# Patient Record
Sex: Female | Born: 1940 | Hispanic: No | Marital: Married | Smoking: Never smoker
Health system: Southern US, Community
[De-identification: ages and names within clinical notes are randomized; demographics above are authoritative.]

## PROBLEM LIST (undated history)

## (undated) DIAGNOSIS — M1990 Unspecified osteoarthritis, unspecified site: Secondary | ICD-10-CM

## (undated) DIAGNOSIS — Z9889 Other specified postprocedural states: Secondary | ICD-10-CM

## (undated) DIAGNOSIS — T8859XA Other complications of anesthesia, initial encounter: Secondary | ICD-10-CM

## (undated) DIAGNOSIS — Z87448 Personal history of other diseases of urinary system: Secondary | ICD-10-CM

## (undated) DIAGNOSIS — R112 Nausea with vomiting, unspecified: Secondary | ICD-10-CM

## (undated) HISTORY — PX: EYE SURGERY: SHX253

## (undated) HISTORY — DX: Unspecified osteoarthritis, unspecified site: M19.90

## (undated) HISTORY — PX: CYST EXCISION: SHX5701

## (undated) HISTORY — DX: Personal history of other diseases of urinary system: Z87.448

---

## 1999-12-17 HISTORY — PX: KNEE SURGERY: SHX244

## 2002-01-15 HISTORY — PX: KNEE SURGERY: SHX244

## 2019-11-13 ENCOUNTER — Ambulatory Visit (INDEPENDENT_AMBULATORY_CARE_PROVIDER_SITE_OTHER): Payer: Medicare HMO | Admitting: Orthopaedic Surgery

## 2019-11-13 ENCOUNTER — Other Ambulatory Visit: Payer: Self-pay

## 2019-11-13 ENCOUNTER — Encounter: Payer: Self-pay | Admitting: Orthopaedic Surgery

## 2019-11-13 ENCOUNTER — Ambulatory Visit (INDEPENDENT_AMBULATORY_CARE_PROVIDER_SITE_OTHER): Payer: Medicare HMO

## 2019-11-13 VITALS — BP 139/85 | HR 82 | Ht 63.0 in | Wt 176.0 lb

## 2019-11-13 DIAGNOSIS — G8929 Other chronic pain: Secondary | ICD-10-CM

## 2019-11-13 DIAGNOSIS — T84033A Mechanical loosening of internal left knee prosthetic joint, initial encounter: Secondary | ICD-10-CM | POA: Diagnosis not present

## 2019-11-13 DIAGNOSIS — M25562 Pain in left knee: Secondary | ICD-10-CM | POA: Diagnosis not present
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
## 2019-11-16 DIAGNOSIS — T84033A Mechanical loosening of internal left knee prosthetic joint, initial encounter: Secondary | ICD-10-CM | POA: Insufficient documentation

## 2019-11-24 ENCOUNTER — Other Ambulatory Visit: Payer: Self-pay

## 2019-11-25 ENCOUNTER — Telehealth: Payer: Self-pay | Admitting: Radiology

## 2019-11-25 NOTE — Telephone Encounter (Signed)
I left voicemail requesting return call from patient.  We need to find out in which facility Dr. Mila Palmer did her left total knee surgery in 2001 so that we can get records in regards to what implant she currently has.

## 2019-11-28 NOTE — Telephone Encounter (Signed)
I left voicemail for return call. 

## 2019-12-01 NOTE — Telephone Encounter (Signed)
I have tried to reach patient x 2. Did Dr. Ophelia Charter have you trying to reach her as well to find out what type of implant she has? She has not returned my call.

## 2019-12-15 ENCOUNTER — Telehealth: Payer: Self-pay | Admitting: Orthopaedic Surgery

## 2019-12-15 NOTE — Telephone Encounter (Signed)
Patient called to say she went directly to [facility]. She requested the records related to her left knee arthroplasty done in October of 1999.  She was told everything prior to 2012 had been "destroyed".  She states having had the right knee done in February 2004. She will be emailing 6 pages of notes obtained from the offices of Dr. Mila Palmer & Dr. Dimas Aguas (both retired) relating to the right knee just in case there is mention of the equipment.  She has read through the notes and does not see anything, but would like for Dr. Ophelia Charter to have.

## 2019-12-15 NOTE — Telephone Encounter (Signed)
FYI

## 2019-12-16 NOTE — Telephone Encounter (Signed)
OK , we will have to schedule her for revision and both femur and tibial components.      TKA Revision with Depuy . Thanks

## 2019-12-16 NOTE — Telephone Encounter (Signed)
noted 

## 2019-12-24 ENCOUNTER — Ambulatory Visit (INDEPENDENT_AMBULATORY_CARE_PROVIDER_SITE_OTHER): Payer: Medicare HMO | Admitting: Surgery

## 2019-12-24 ENCOUNTER — Encounter: Payer: Self-pay | Admitting: Surgery

## 2019-12-24 ENCOUNTER — Other Ambulatory Visit: Payer: Self-pay

## 2019-12-24 VITALS — BP 179/82 | HR 75 | Ht 63.0 in | Wt 176.0 lb

## 2019-12-24 DIAGNOSIS — T84033A Mechanical loosening of internal left knee prosthetic joint, initial encounter: Secondary | ICD-10-CM

## 2019-12-24 NOTE — Progress Notes (Signed)
79 year old white female with history of left total knee component loosening comes in for preop evaluation.  States that she continues have ongoing feeling of pain instability in the left knee.  She is want to proceed with left total knee revision as scheduled.  We have received preop medical and cardiac clearance.  Review of systems negative.  History and physical performed.  Surgical procedure discussed.  All questions answered.

## 2019-12-29 NOTE — Pre-Procedure Instructions (Signed)
[facility] [phone] - [place], [place] - [address] AT [address] [place] [address] Phone: [phone] Fax: [phone]      Your procedure is scheduled on Friday September 17th.  Report to [facility] Main Entrance "A" at 5:30 A.M., and check in at the Admitting office.  Call this number if you have problems the morning of surgery:  [phone]  Call [phone] if you have any questions prior to your surgery date Monday-Friday 8am-4pm    Remember:  Do not eat after midnight the night before your surgery  You may drink clear liquids until 4:30 the morning of your surgery.   Clear liquids allowed are: Water, Non-Citrus Juices (without pulp), Carbonated Beverages, Clear Tea, Black Coffee Only, and Gatorade Patient Instructions  . The night before surgery:  o No food after midnight. ONLY clear liquids after midnight  . The day of surgery (if you do NOT have diabetes):  o Drink ONE (1) Pre-Surgery Clear Ensure as directed.   o This drink was given to you during your hospital  pre-op appointment visit. o The pre-op nurse will instruct you on the time to drink the  Pre-Surgery Ensure depending on your surgery time- finish by 4:30 AM morning of surgery o Finish the drink at the designated time by the pre-op nurse.  o Nothing else to drink after completing the  Pre-Surgery Clear Ensure.         If you have questions, please contact your surgeon's office.     Take these medicines the morning of surgery with A SIP OF WATER -NONE  As of today, STOP taking any Aspirin (unless otherwise instructed by your surgeon) Aleve, Naproxen, Ibuprofen, Motrin, Advil, Goody's, BC's, all herbal medications, fish oil, and all vitamins.                      Do not wear jewelry, make up, or nail polish            Do not wear lotions, powders, perfumes/colognes, or deodorant.            Do not shave 48 hours prior to surgery.  Men may shave face and  neck.            Do not bring valuables to the hospital.            [facility] is not responsible for any belongings or valuables.  Do NOT Smoke (Tobacco/Vaping) or drink Alcohol 24 hours prior to your procedure If you use a CPAP at night, you may bring all equipment for your overnight stay.   Contacts, glasses, dentures or bridgework may not be worn into surgery.      For patients admitted to the hospital, discharge time will be determined by your treatment team.   Patients discharged the day of surgery will not be allowed to drive home, and someone needs to stay with them for 24 hours.    Special instructions:   [facility]- Preparing For Surgery  Before surgery, you can play an important role. Because skin is not sterile, your skin needs to be as free of germs as possible. You can reduce the number of germs on your skin by washing with CHG (chlorahexidine gluconate) Soap before surgery.  CHG is an antiseptic cleaner which kills germs and bonds with the skin to continue killing germs even after washing.    Oral Hygiene is also important to reduce your risk of  infection.  Remember - BRUSH YOUR TEETH THE MORNING OF SURGERY WITH YOUR REGULAR TOOTHPASTE  Please do not use if you have an allergy to CHG or antibacterial soaps. If your skin becomes reddened/irritated stop using the CHG.  Do not shave (including legs and underarms) for at least 48 hours prior to first CHG shower. It is OK to shave your face.  Please follow these instructions carefully.   1. Shower the NIGHT BEFORE SURGERY and the MORNING OF SURGERY with CHG Soap.   2. If you chose to wash your hair, wash your hair first as usual with your normal shampoo.  3. After you shampoo, rinse your hair and body thoroughly to remove the shampoo.  4. Use CHG as you would any other liquid soap. You can apply CHG directly to the skin and wash gently with a scrungie or a clean washcloth.   5. Apply the CHG Soap to your body ONLY FROM  THE NECK DOWN.  Do not use on open wounds or open sores. Avoid contact with your eyes, ears, mouth and genitals (private parts). Wash Face and genitals (private parts)  with your normal soap.   6. Wash thoroughly, paying special attention to the area where your surgery will be performed.  7. Thoroughly rinse your body with warm water from the neck down.  8. DO NOT shower/wash with your normal soap after using and rinsing off the CHG Soap.  9. Pat yourself dry with a CLEAN TOWEL.  10. Wear CLEAN PAJAMAS to bed the night before surgery  11. Place CLEAN SHEETS on your bed the night of your first shower and DO NOT SLEEP WITH PETS.   Day of Surgery: Wear Clean/Comfortable clothing the morning of surgery Do not apply any deodorants/lotions.   Remember to brush your teeth WITH YOUR REGULAR TOOTHPASTE.   Please read over the following fact sheets that you were given.

## 2019-12-30 ENCOUNTER — Encounter (HOSPITAL_COMMUNITY)
Admission: RE | Admit: 2019-12-30 | Discharge: 2019-12-30 | Disposition: A | Discharge status: Home / Self Care | Payer: Medicare HMO | Source: Ambulatory Visit | Attending: Orthopaedic Surgery | Admitting: Orthopaedic Surgery

## 2019-12-30 ENCOUNTER — Ambulatory Visit (HOSPITAL_COMMUNITY)
Admission: RE | Admit: 2019-12-30 | Discharge: 2019-12-30 | Disposition: A | Discharge status: Home / Self Care | Payer: Medicare HMO | Source: Ambulatory Visit | Attending: Surgery | Admitting: Surgery

## 2019-12-30 ENCOUNTER — Other Ambulatory Visit: Payer: Self-pay

## 2019-12-30 ENCOUNTER — Encounter (HOSPITAL_COMMUNITY): Payer: Self-pay

## 2019-12-30 ENCOUNTER — Other Ambulatory Visit (HOSPITAL_COMMUNITY)
Admission: RE | Admit: 2019-12-30 | Discharge: 2019-12-30 | Disposition: A | Discharge status: Home / Self Care | Payer: Medicare HMO | Source: Ambulatory Visit | Attending: Orthopaedic Surgery | Admitting: Orthopaedic Surgery

## 2019-12-30 DIAGNOSIS — Z01818 Encounter for other preprocedural examination: Secondary | ICD-10-CM

## 2019-12-30 DIAGNOSIS — K449 Diaphragmatic hernia without obstruction or gangrene: Secondary | ICD-10-CM | POA: Diagnosis not present

## 2019-12-30 DIAGNOSIS — Z20822 Contact with and (suspected) exposure to covid-19: Secondary | ICD-10-CM | POA: Diagnosis not present

## 2019-12-30 DIAGNOSIS — I700 Atherosclerosis of aorta: Secondary | ICD-10-CM | POA: Insufficient documentation

## 2019-12-30 HISTORY — DX: Nausea with vomiting, unspecified: Z98.890

## 2019-12-30 HISTORY — DX: Nausea with vomiting, unspecified: R11.2

## 2019-12-30 HISTORY — DX: Other complications of anesthesia, initial encounter: T88.59XA

## 2019-12-30 LAB — URINALYSIS, ROUTINE W REFLEX MICROSCOPIC
Bacteria, UA: NONE SEEN
Bilirubin Urine: NEGATIVE
Glucose, UA: NEGATIVE mg/dL
Hgb urine dipstick: NEGATIVE
Ketones, ur: NEGATIVE mg/dL
Nitrite: NEGATIVE
Protein, ur: NEGATIVE mg/dL
Specific Gravity, Urine: 1.017 (ref 1.005–1.030)
pH: 6 (ref 5.0–8.0)

## 2019-12-30 LAB — CBC
HCT: 42.6 % (ref 36.0–46.0)
Hemoglobin: 13.2 g/dL (ref 12.0–15.0)
MCH: 29.7 pg (ref 26.0–34.0)
MCHC: 31 g/dL (ref 30.0–36.0)
MCV: 95.7 fL (ref 80.0–100.0)
Platelets: 321 10*3/uL (ref 150–400)
RBC: 4.45 MIL/uL (ref 3.87–5.11)
RDW: 15 % (ref 11.5–15.5)
WBC: 7.2 10*3/uL (ref 4.0–10.5)
nRBC: 0 % (ref 0.0–0.2)

## 2019-12-30 LAB — COMPREHENSIVE METABOLIC PANEL
ALT: 12 U/L (ref 0–44)
AST: 18 U/L (ref 15–41)
Albumin: 4 g/dL (ref 3.5–5.0)
Alkaline Phosphatase: 48 U/L (ref 38–126)
Anion gap: 10 (ref 5–15)
BUN: 20 mg/dL (ref 8–23)
CO2: 24 mmol/L (ref 22–32)
Calcium: 9.6 mg/dL (ref 8.9–10.3)
Chloride: 103 mmol/L (ref 98–111)
Creatinine, Ser: 0.75 mg/dL (ref 0.44–1.00)
GFR calc Af Amer: >60 mL/min (ref >60)
GFR calc non Af Amer: >60 mL/min (ref >60)
Glucose, Bld: 97 mg/dL (ref 70–99)
Potassium: 3.8 mmol/L (ref 3.5–5.1)
Sodium: 137 mmol/L (ref 135–145)
Total Bilirubin: 0.6 mg/dL (ref 0.3–1.2)
Total Protein: 7 g/dL (ref 6.5–8.1)

## 2019-12-30 LAB — SURGICAL PCR SCREEN
MRSA, PCR: NEGATIVE
Staphylococcus aureus: NEGATIVE

## 2019-12-30 LAB — SARS CORONAVIRUS 2 (TAT 6-24 HRS): SARS Coronavirus 2: NEGATIVE

## 2019-12-30 LAB — TYPE AND SCREEN
ABO/RH(D): O POS
Antibody Screen: NEGATIVE

## 2019-12-30 NOTE — Progress Notes (Signed)
PCP - dr. Carin Primrose in [place], [place] Cardiologist - Denies  Chest x-ray - 12/30/19 EKG - 12/30/19 Stress Test - Denies ECHO - Denies Cardiac Cath - Denies  Sleep Study - Denies OSA DM - Denies  ERAS Protcol -Instructed PRE-SURGERY Ensure given  COVID TEST- 12/30/19  Anesthesia review: No  Patient denies shortness of breath, fever, cough and chest pain at PAT appointment   All instructions explained to the patient, with a verbal understanding of the material. Patient agrees to go over the instructions while at home for a better understanding. Patient also instructed to self quarantine after being tested for COVID-19. The opportunity to ask questions was provided.

## 2020-01-01 MED ORDER — BUPIVACAINE LIPOSOME 1.3 % IJ SUSP
20.0000 mL | Freq: Once | INTRAMUSCULAR | Status: DC
  Filled 2020-01-01: qty 20

## 2020-01-01 NOTE — Anesthesia Preprocedure Evaluation (Addendum)
Anesthesia Evaluation  Patient identified by MRN, date of birth, ID band Patient awake    Reviewed: Allergy & Precautions, NPO status , Patient's Chart, lab work & pertinent test results  Airway Mallampati: II  TM Distance: >3 FB Neck ROM: Full    Dental no notable dental hx. (+) Upper Dentures, Dental Advisory Given   Pulmonary neg pulmonary ROS,    Pulmonary exam normal breath sounds clear to auscultation       Cardiovascular Exercise Tolerance: Good negative cardio ROS Normal cardiovascular exam Rhythm:Regular Rate:Normal  12-30-19  NSR R73   Neuro/Psych negative neurological ROS  negative psych ROS   GI/Hepatic negative GI ROS, Neg liver ROS,   Endo/Other  negative endocrine ROS  Renal/GU negative Renal ROS     Musculoskeletal  (+) Arthritis ,   Abdominal (+) + obese,   Peds  Hematology negative hematology ROS (+)   Anesthesia Other Findings   Reproductive/Obstetrics negative OB ROS                            Anesthesia Physical Anesthesia Plan  ASA: II  Anesthesia Plan: Spinal   Post-op Pain Management:  Regional for Post-op pain   Induction:   PONV Risk Score and Plan: 3 and Treatment may vary due to age or medical condition, Ondansetron and Dexamethasone  Airway Management Planned: Nasal Cannula and Natural Airway  Additional Equipment: None  Intra-op Plan:   Post-operative Plan:   Informed Consent: I have reviewed the patients History and Physical, chart, labs and discussed the procedure including the risks, benefits and alternatives for the proposed anesthesia with the patient or authorized representative who has indicated his/her understanding and acceptance.     Dental advisory given  Plan Discussed with: CRNA and Anesthesiologist  Anesthesia Plan Comments: (Spinal w L Adductor canal block)       Anesthesia Quick Evaluation

## 2020-01-02 ENCOUNTER — Observation Stay (HOSPITAL_COMMUNITY): Payer: Medicare HMO

## 2020-01-02 ENCOUNTER — Encounter (HOSPITAL_COMMUNITY)
Admission: RE | Disposition: A | Discharge status: Home / Self Care | Payer: Self-pay | Source: Home / Self Care | Attending: Orthopaedic Surgery

## 2020-01-02 ENCOUNTER — Observation Stay (HOSPITAL_COMMUNITY)
Admission: RE | Admit: 2020-01-02 | Discharge: 2020-01-03 | Disposition: A | Discharge status: Home / Self Care | Payer: Medicare HMO | Attending: Orthopaedic Surgery | Admitting: Orthopaedic Surgery

## 2020-01-02 ENCOUNTER — Encounter (HOSPITAL_COMMUNITY): Payer: Self-pay | Admitting: Orthopaedic Surgery

## 2020-01-02 ENCOUNTER — Ambulatory Visit (HOSPITAL_COMMUNITY): Payer: Medicare HMO | Admitting: Anesthesiology

## 2020-01-02 ENCOUNTER — Ambulatory Visit (HOSPITAL_COMMUNITY): Payer: Medicare HMO

## 2020-01-02 ENCOUNTER — Ambulatory Visit (HOSPITAL_COMMUNITY): Payer: Medicare HMO | Admitting: Vascular Surgery

## 2020-01-02 ENCOUNTER — Other Ambulatory Visit: Payer: Self-pay

## 2020-01-02 DIAGNOSIS — M25562 Pain in left knee: Secondary | ICD-10-CM

## 2020-01-02 DIAGNOSIS — Y838 Other surgical procedures as the cause of abnormal reaction of the patient, or of later complication, without mention of misadventure at the time of the procedure: Secondary | ICD-10-CM | POA: Diagnosis not present

## 2020-01-02 DIAGNOSIS — M1712 Unilateral primary osteoarthritis, left knee: Secondary | ICD-10-CM | POA: Insufficient documentation

## 2020-01-02 DIAGNOSIS — Z96652 Presence of left artificial knee joint: Secondary | ICD-10-CM

## 2020-01-02 DIAGNOSIS — Y793 Surgical instruments, materials and orthopedic devices (including sutures) associated with adverse incidents: Secondary | ICD-10-CM | POA: Insufficient documentation

## 2020-01-02 DIAGNOSIS — T8484XA Pain due to internal orthopedic prosthetic devices, implants and grafts, initial encounter: Secondary | ICD-10-CM | POA: Diagnosis present

## 2020-01-02 DIAGNOSIS — T84033A Mechanical loosening of internal left knee prosthetic joint, initial encounter: Secondary | ICD-10-CM | POA: Diagnosis not present

## 2020-01-02 DIAGNOSIS — Z419 Encounter for procedure for purposes other than remedying health state, unspecified: Secondary | ICD-10-CM | POA: Diagnosis not present

## 2020-01-02 DIAGNOSIS — Z09 Encounter for follow-up examination after completed treatment for conditions other than malignant neoplasm: Secondary | ICD-10-CM

## 2020-01-02 HISTORY — PX: TOTAL KNEE REVISION: SHX996

## 2020-01-02 LAB — ABO/RH: ABO/RH(D): O POS

## 2020-01-02 SURGERY — TOTAL KNEE REVISION
Anesthesia: Spinal | Site: Knee | Laterality: Left

## 2020-01-02 MED ORDER — FERROUS SULFATE 325 (65 FE) MG PO TABS
325.0000 mg | ORAL_TABLET | Freq: Two times a day (BID) | ORAL | Status: DC
  Administered 2020-01-02 – 2020-01-03 (×2): 325 mg via ORAL
  Filled 2020-01-02 (×2): qty 1

## 2020-01-02 MED ORDER — METHOCARBAMOL 500 MG PO TABS: 500.0000 mg | ORAL_TABLET | Freq: Four times a day (QID) | ORAL | 0 refills | Status: AC | PRN

## 2020-01-02 MED ORDER — ONDANSETRON HCL 4 MG/2ML IJ SOLN
INTRAMUSCULAR | Status: DC | PRN
  Administered 2020-01-02: 4 mg via INTRAVENOUS

## 2020-01-02 MED ORDER — FENTANYL CITRATE (PF) 250 MCG/5ML IJ SOLN
INTRAMUSCULAR | Status: AC
  Filled 2020-01-02: qty 5

## 2020-01-02 MED ORDER — ACETAMINOPHEN 500 MG PO TABS
ORAL_TABLET | ORAL | Status: AC
  Administered 2020-01-02: 1000 mg
  Filled 2020-01-02: qty 2

## 2020-01-02 MED ORDER — CHLORHEXIDINE GLUCONATE 0.12 % MT SOLN: 15.0000 mL | Freq: Once | OROMUCOSAL | Status: AC

## 2020-01-02 MED ORDER — ASPIRIN EC 325 MG PO TBEC: 325.0000 mg | DELAYED_RELEASE_TABLET | Freq: Every day | ORAL | 0 refills | Status: AC

## 2020-01-02 MED ORDER — BUPIVACAINE IN DEXTROSE 0.75-8.25 % IT SOLN
INTRATHECAL | Status: DC | PRN
  Administered 2020-01-02: 12 mg via INTRATHECAL

## 2020-01-02 MED ORDER — CEFAZOLIN SODIUM-DEXTROSE 2-4 GM/100ML-% IV SOLN
2.0000 g | INTRAVENOUS | Status: AC
  Administered 2020-01-02: 2 g via INTRAVENOUS

## 2020-01-02 MED ORDER — ACETAMINOPHEN 10 MG/ML IV SOLN: 1000.0000 mg | Freq: Once | INTRAVENOUS | Status: DC | PRN

## 2020-01-02 MED ORDER — ASPIRIN EC 325 MG PO TBEC
325.0000 mg | DELAYED_RELEASE_TABLET | Freq: Every day | ORAL | Status: DC
  Administered 2020-01-03: 325 mg via ORAL
  Filled 2020-01-02: qty 1

## 2020-01-02 MED ORDER — DEXAMETHASONE SODIUM PHOSPHATE 10 MG/ML IJ SOLN
INTRAMUSCULAR | Status: DC | PRN
  Administered 2020-01-02: 10 mg via INTRAVENOUS

## 2020-01-02 MED ORDER — DEXAMETHASONE SODIUM PHOSPHATE 10 MG/ML IJ SOLN
INTRAMUSCULAR | Status: AC
  Filled 2020-01-02: qty 1

## 2020-01-02 MED ORDER — BUPIVACAINE LIPOSOME 1.3 % IJ SUSP
INTRAMUSCULAR | Status: DC | PRN
  Administered 2020-01-02: 20 mL

## 2020-01-02 MED ORDER — ORAL CARE MOUTH RINSE: 15.0000 mL | Freq: Once | OROMUCOSAL | Status: AC

## 2020-01-02 MED ORDER — CEFAZOLIN SODIUM-DEXTROSE 1-4 GM/50ML-% IV SOLN
1.0000 g | Freq: Three times a day (TID) | INTRAVENOUS | Status: AC
  Administered 2020-01-02 (×2): 1 g via INTRAVENOUS
  Filled 2020-01-02 (×3): qty 50

## 2020-01-02 MED ORDER — ONDANSETRON HCL 4 MG/2ML IJ SOLN: 4.0000 mg | Freq: Once | INTRAMUSCULAR | Status: DC | PRN

## 2020-01-02 MED ORDER — CHLORHEXIDINE GLUCONATE 0.12 % MT SOLN
OROMUCOSAL | Status: AC
  Administered 2020-01-02: 15 mL via OROMUCOSAL
  Filled 2020-01-02: qty 15

## 2020-01-02 MED ORDER — ACETAMINOPHEN 500 MG PO TABS
ORAL_TABLET | ORAL | Status: AC
  Filled 2020-01-02: qty 2

## 2020-01-02 MED ORDER — METHOCARBAMOL 500 MG PO TABS
ORAL_TABLET | ORAL | Status: AC
  Filled 2020-01-02: qty 1

## 2020-01-02 MED ORDER — METHOCARBAMOL 500 MG PO TABS
500.0000 mg | ORAL_TABLET | Freq: Four times a day (QID) | ORAL | Status: DC | PRN
  Administered 2020-01-02 – 2020-01-03 (×3): 500 mg via ORAL
  Filled 2020-01-02 (×2): qty 1

## 2020-01-02 MED ORDER — ROPIVACAINE HCL 7.5 MG/ML IJ SOLN
INTRAMUSCULAR | Status: DC | PRN
  Administered 2020-01-02: 20 mL via PERINEURAL

## 2020-01-02 MED ORDER — ONDANSETRON HCL 4 MG PO TABS: 4.0000 mg | ORAL_TABLET | Freq: Four times a day (QID) | ORAL | Status: DC | PRN

## 2020-01-02 MED ORDER — TRANEXAMIC ACID-NACL 1000-0.7 MG/100ML-% IV SOLN
INTRAVENOUS | Status: AC
  Filled 2020-01-02: qty 100

## 2020-01-02 MED ORDER — CLONIDINE HCL (ANALGESIA) 100 MCG/ML EP SOLN
EPIDURAL | Status: DC | PRN
  Administered 2020-01-02: 100 ug

## 2020-01-02 MED ORDER — METOCLOPRAMIDE HCL 5 MG PO TABS: 5.0000 mg | ORAL_TABLET | Freq: Three times a day (TID) | ORAL | Status: DC | PRN

## 2020-01-02 MED ORDER — POLYETHYLENE GLYCOL 3350 17 G PO PACK: 17.0000 g | PACK | Freq: Every day | ORAL | Status: DC | PRN

## 2020-01-02 MED ORDER — HYDROMORPHONE HCL 1 MG/ML IJ SOLN
0.5000 mg | INTRAMUSCULAR | Status: DC | PRN
  Administered 2020-01-02 – 2020-01-03 (×2): 0.5 mg via INTRAVENOUS
  Filled 2020-01-02 (×2): qty 1

## 2020-01-02 MED ORDER — FENTANYL CITRATE (PF) 100 MCG/2ML IJ SOLN: 25.0000 ug | INTRAMUSCULAR | Status: DC | PRN

## 2020-01-02 MED ORDER — SODIUM CHLORIDE 0.9 % IV SOLN: INTRAVENOUS | Status: DC

## 2020-01-02 MED ORDER — OXYCODONE HCL 5 MG PO TABS
5.0000 mg | ORAL_TABLET | Freq: Four times a day (QID) | ORAL | Status: DC | PRN
  Administered 2020-01-02 – 2020-01-03 (×4): 5 mg via ORAL
  Filled 2020-01-02 (×5): qty 1

## 2020-01-02 MED ORDER — PROPOFOL 10 MG/ML IV BOLUS
INTRAVENOUS | Status: AC
  Filled 2020-01-02: qty 20

## 2020-01-02 MED ORDER — TRANEXAMIC ACID-NACL 1000-0.7 MG/100ML-% IV SOLN
INTRAVENOUS | Status: DC | PRN
  Administered 2020-01-02: 1000 mg via INTRAVENOUS

## 2020-01-02 MED ORDER — LIDOCAINE 2% (20 MG/ML) 5 ML SYRINGE
INTRAMUSCULAR | Status: AC
  Filled 2020-01-02: qty 5

## 2020-01-02 MED ORDER — MIDAZOLAM HCL 2 MG/2ML IJ SOLN
INTRAMUSCULAR | Status: AC
  Filled 2020-01-02: qty 2

## 2020-01-02 MED ORDER — PHENYLEPHRINE HCL-NACL 10-0.9 MG/250ML-% IV SOLN
INTRAVENOUS | Status: DC | PRN
  Administered 2020-01-02: 25 ug/min via INTRAVENOUS

## 2020-01-02 MED ORDER — OXYCODONE-ACETAMINOPHEN 5-325 MG PO TABS: 1.0000 | ORAL_TABLET | Freq: Four times a day (QID) | ORAL | 0 refills | Status: AC | PRN

## 2020-01-02 MED ORDER — METOCLOPRAMIDE HCL 5 MG/ML IJ SOLN: 5.0000 mg | Freq: Three times a day (TID) | INTRAMUSCULAR | Status: DC | PRN

## 2020-01-02 MED ORDER — PHENOL 1.4 % MT LIQD: 1.0000 | OROMUCOSAL | Status: DC | PRN

## 2020-01-02 MED ORDER — ONDANSETRON HCL 4 MG/2ML IJ SOLN
INTRAMUSCULAR | Status: AC
  Filled 2020-01-02: qty 2

## 2020-01-02 MED ORDER — CEFAZOLIN SODIUM-DEXTROSE 2-4 GM/100ML-% IV SOLN
INTRAVENOUS | Status: AC
  Filled 2020-01-02: qty 100

## 2020-01-02 MED ORDER — BUPIVACAINE HCL (PF) 0.25 % IJ SOLN
INTRAMUSCULAR | Status: AC
  Filled 2020-01-02: qty 30

## 2020-01-02 MED ORDER — FENTANYL CITRATE (PF) 100 MCG/2ML IJ SOLN
INTRAMUSCULAR | Status: DC | PRN
  Administered 2020-01-02: 50 ug via INTRAVENOUS

## 2020-01-02 MED ORDER — BUPIVACAINE HCL (PF) 0.25 % IJ SOLN
INTRAMUSCULAR | Status: DC | PRN
  Administered 2020-01-02: 30 mL

## 2020-01-02 MED ORDER — LACTATED RINGERS IV SOLN: INTRAVENOUS | Status: DC | PRN

## 2020-01-02 MED ORDER — ACETAMINOPHEN 325 MG PO TABS
325.0000 mg | ORAL_TABLET | Freq: Four times a day (QID) | ORAL | Status: DC | PRN
  Administered 2020-01-03 (×2): 650 mg via ORAL
  Filled 2020-01-02 (×3): qty 2

## 2020-01-02 MED ORDER — ONDANSETRON HCL 4 MG/2ML IJ SOLN
4.0000 mg | Freq: Four times a day (QID) | INTRAMUSCULAR | Status: DC | PRN
  Administered 2020-01-02 – 2020-01-03 (×2): 4 mg via INTRAVENOUS
  Filled 2020-01-02 (×2): qty 2

## 2020-01-02 MED ORDER — DOCUSATE SODIUM 100 MG PO CAPS
100.0000 mg | ORAL_CAPSULE | Freq: Two times a day (BID) | ORAL | Status: DC
  Administered 2020-01-02 (×2): 100 mg via ORAL
  Filled 2020-01-02 (×3): qty 1

## 2020-01-02 MED ORDER — METHOCARBAMOL 1000 MG/10ML IJ SOLN
500.0000 mg | Freq: Four times a day (QID) | INTRAVENOUS | Status: DC | PRN
  Filled 2020-01-02: qty 5

## 2020-01-02 MED ORDER — PROPOFOL 500 MG/50ML IV EMUL
INTRAVENOUS | Status: DC | PRN
  Administered 2020-01-02: 75 ug/kg/min via INTRAVENOUS

## 2020-01-02 MED ORDER — SODIUM CHLORIDE 0.9 % IR SOLN
Status: DC | PRN
  Administered 2020-01-02: 3000 mL
  Administered 2020-01-02: 1000 mL

## 2020-01-02 MED ORDER — LACTATED RINGERS IV SOLN: INTRAVENOUS | Status: DC

## 2020-01-02 MED ORDER — MENTHOL 3 MG MT LOZG: 1.0000 | LOZENGE | OROMUCOSAL | Status: DC | PRN

## 2020-01-02 SURGICAL SUPPLY — 77 items
BANDAGE ESMARK 6X9 LF (GAUZE/BANDAGES/DRESSINGS) ×1 IMPLANT
BIT DRILL QUICK REL 1/8 2PK SL (DRILL) ×2 IMPLANT
BLADE CLIPPER SURG (BLADE) IMPLANT
BLADE SAGITTAL 25.0X1.19X90 (BLADE) ×2 IMPLANT
BLADE SAGITTAL 25.0X1.19X90MM (BLADE) ×1
BLADE SAGITTAL 25.0X1.27X90 (BLADE) ×4 IMPLANT
BLADE SAGITTAL 25.0X1.27X90MM (BLADE) ×2
BLADE SAW SGTL 81X20 HD (BLADE) ×3 IMPLANT
BNDG ELASTIC 4X5.8 VLCR STR LF (GAUZE/BANDAGES/DRESSINGS) ×3 IMPLANT
BNDG ELASTIC 6X5.8 VLCR STR LF (GAUZE/BANDAGES/DRESSINGS) ×3 IMPLANT
BNDG ESMARK 6X9 LF (GAUZE/BANDAGES/DRESSINGS) ×3
BOWL SMART MIX CTS (DISPOSABLE) IMPLANT
CEMENT BONE R 1X40 (Cement) ×6 IMPLANT
COVER BACK TABLE 24X17X13 BIG (DRAPES) IMPLANT
COVER SURGICAL LIGHT HANDLE (MISCELLANEOUS) ×3 IMPLANT
COVER WAND RF STERILE (DRAPES) ×3 IMPLANT
CUFF TOURN SGL QUICK 34 (TOURNIQUET CUFF)
CUFF TOURN SGL QUICK 42 (TOURNIQUET CUFF) IMPLANT
CUFF TRNQT CYL 34X4.125X (TOURNIQUET CUFF) IMPLANT
DISC DIAMOND MED (BURR) IMPLANT
DRAPE C-ARM 42X72 X-RAY (DRAPES) ×3 IMPLANT
DRAPE IMP U-DRAPE 54X76 (DRAPES) ×3 IMPLANT
DRAPE ORTHO SPLIT 77X108 STRL (DRAPES) ×4
DRAPE SURG ORHT 6 SPLT 77X108 (DRAPES) ×2 IMPLANT
DRAPE U-SHAPE 47X51 STRL (DRAPES) ×3 IMPLANT
DRILL QUICK RELEASE 1/8 INCH (DRILL) ×4
DRSG MEPILEX BORDER 4X8 (GAUZE/BANDAGES/DRESSINGS) ×3 IMPLANT
DRSG MEPILEX SACRM 8.7X9.8 (GAUZE/BANDAGES/DRESSINGS) ×3 IMPLANT
DRSG PAD ABDOMINAL 8X10 ST (GAUZE/BANDAGES/DRESSINGS) ×3 IMPLANT
DURAPREP 26ML APPLICATOR (WOUND CARE) ×3 IMPLANT
ELECT REM PT RETURN 9FT ADLT (ELECTROSURGICAL) ×3
ELECTRODE REM PT RTRN 9FT ADLT (ELECTROSURGICAL) ×1 IMPLANT
EVACUATOR 1/8 PVC DRAIN (DRAIN) IMPLANT
GAUZE SPONGE 4X4 12PLY STRL (GAUZE/BANDAGES/DRESSINGS) ×3 IMPLANT
GAUZE XEROFORM 1X8 LF (GAUZE/BANDAGES/DRESSINGS) ×3 IMPLANT
GLOVE BIOGEL PI IND STRL 8 (GLOVE) ×2 IMPLANT
GLOVE BIOGEL PI INDICATOR 8 (GLOVE) ×4
GLOVE ORTHO TXT STRL SZ7.5 (GLOVE) ×6 IMPLANT
GOWN STRL REUS W/ TWL LRG LVL3 (GOWN DISPOSABLE) ×1 IMPLANT
GOWN STRL REUS W/ TWL XL LVL3 (GOWN DISPOSABLE) ×1 IMPLANT
GOWN STRL REUS W/TWL 2XL LVL3 (GOWN DISPOSABLE) ×3 IMPLANT
GOWN STRL REUS W/TWL LRG LVL3 (GOWN DISPOSABLE) ×2
GOWN STRL REUS W/TWL XL LVL3 (GOWN DISPOSABLE) ×2
HANDPIECE INTERPULSE COAX TIP (DISPOSABLE)
INSERT TIB FIXED AS SZ3-4 (Insert) ×3 IMPLANT
KIT BASIN OR (CUSTOM PROCEDURE TRAY) ×3 IMPLANT
KIT TURNOVER KIT B (KITS) ×3 IMPLANT
MANIFOLD NEPTUNE II (INSTRUMENTS) ×3 IMPLANT
NEEDLE 1/2 CIR MAYO (NEEDLE) ×3 IMPLANT
NS IRRIG 1000ML POUR BTL (IV SOLUTION) ×3 IMPLANT
PACK TOTAL JOINT (CUSTOM PROCEDURE TRAY) ×3 IMPLANT
PACK UNIVERSAL I (CUSTOM PROCEDURE TRAY) ×3 IMPLANT
PAD ARMBOARD 7.5X6 YLW CONV (MISCELLANEOUS) ×6 IMPLANT
PAD CAST 4YDX4 CTTN HI CHSV (CAST SUPPLIES) ×1 IMPLANT
PADDING CAST ABS 6INX4YD NS (CAST SUPPLIES) ×2
PADDING CAST ABS COTTON 6X4 NS (CAST SUPPLIES) ×1 IMPLANT
PADDING CAST COTTON 4X4 STRL (CAST SUPPLIES) ×2
PADDING CAST COTTON 6X4 STRL (CAST SUPPLIES) ×3 IMPLANT
PLATE TIB 4 66X46NT ZIER (Knees) ×1 IMPLANT
RASP HELIOCORDIAL MED (MISCELLANEOUS) IMPLANT
SET HNDPC FAN SPRY TIP SCT (DISPOSABLE) IMPLANT
STAPLER VISISTAT 35W (STAPLE) ×3 IMPLANT
STEM FEM EXT NEXGEN 11X145 (Stem) ×3 IMPLANT
SUCTION FRAZIER HANDLE 10FR (MISCELLANEOUS) ×2
SUCTION TUBE FRAZIER 10FR DISP (MISCELLANEOUS) ×1 IMPLANT
SUT VIC AB 0 CT1 27 (SUTURE) ×4
SUT VIC AB 0 CT1 27XBRD ANBCTR (SUTURE) ×2 IMPLANT
SUT VIC AB 1 CT1 27 (SUTURE) ×4
SUT VIC AB 1 CT1 27XBRD ANBCTR (SUTURE) ×2 IMPLANT
SUT VIC AB 2-0 CT1 27 (SUTURE) ×4
SUT VIC AB 2-0 CT1 TAPERPNT 27 (SUTURE) ×2 IMPLANT
SWAB CULTURE ESWAB REG 1ML (MISCELLANEOUS) ×3 IMPLANT
TIBIA ZIMMER (Knees) ×2 IMPLANT
TOWEL GREEN STERILE (TOWEL DISPOSABLE) ×3 IMPLANT
TOWEL GREEN STERILE FF (TOWEL DISPOSABLE) ×3 IMPLANT
TRAY FOLEY MTR SLVR 16FR STAT (SET/KITS/TRAYS/PACK) IMPLANT
WATER STERILE IRR 1000ML POUR (IV SOLUTION) ×9 IMPLANT

## 2020-01-02 NOTE — Addendum Note (Signed)
Addendum  created 01/02/20 1307 by Trevor Iha, MD   Clinical Note Signed

## 2020-01-02 NOTE — Anesthesia Procedure Notes (Addendum)
Anesthesia Regional Block: Adductor canal block   Pre-Anesthetic Checklist: ,, timeout performed, Correct Patient, Correct Site, Correct Laterality, Correct Procedure, Correct Position, site marked, Risks and benefits discussed,  Surgical consent,  Pre-op evaluation,  At surgeon's request and post-op pain management  Laterality: Lower and Left  Prep: chloraprep       Needles:  Injection technique: Single-shot  Needle Type: Echogenic Needle     Needle Length: 9cm  Needle Gauge: 22     Additional Needles:   Procedures:,,,, ultrasound used (permanent image in chart),,,,  Narrative:  Start time: 01/02/2020 7:08 AM End time: 01/02/2020 7:14 AM Injection made incrementally with aspirations every 5 mL.  Performed by: Personally  Anesthesiologist: Trevor Iha, MD  Additional Notes: Block assessed prior to surgery. Pt tolerated procedure well.

## 2020-01-02 NOTE — H&P (Signed)
TOTAL KNEE REVISION ADMISSION H&P  Patient is being admitted for left revision total knee arthroplasty.  Subjective:  Chief Complaint:left knee pain.  HPI: Barbara Reyes, 79 y.o. female, has a history of pain and functional disability in the left knee(s) due to failed previous arthroplasty and patient has failed non-surgical conservative treatments for greater than 12 weeks to include NSAID's and/or analgesics, supervised PT with diminished ADL's post treatment, weight reduction as appropriate and activity modification. The indications for the revision of the total knee arthroplasty are loosening of one or more components. Onset of symptoms was gradual starting 5 years ago with gradually worsening course since that time.  Prior procedures on the left knee(s) include arthroplasty.  Patient currently rates pain in the left knee(s) at 10 out of 10 with activity. There is night pain, worsening of pain with activity and weight bearing, pain that interferes with activities of daily living, pain with passive range of motion and joint swelling.  Patient has evidence of prosthetic loosening by imaging studies. This condition presents safety issues increasing the risk of falls.   There is no current active infection.  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   Past Medical History:  Diagnosis Date   Arthritis    Complication of anesthesia    H/O bladder problems    PONV (postoperative nausea and vomiting)     Past Surgical History:  Procedure Laterality Date   CYST EXCISION     EYE SURGERY     KNEE SURGERY Right 01/2002   right TKA   KNEE SURGERY Left 12/1999   left TKA    Current Facility-Administered Medications  Medication Dose Route Frequency Provider Last Rate Last Admin   acetaminophen (TYLENOL) 500 MG tablet            bupivacaine liposome (EXPAREL) 1.3 % injection 266 mg  20 mL Infiltration Once [name], MD        ceFAZolin (ANCEF) 2-4 GM/100ML-% IVPB            ceFAZolin (ANCEF) IVPB 2g/100 mL premix  2 g Intravenous On Call to OR Naida Sleight, PA-C       lactated ringers infusion   Intravenous Continuous Marcene Duos, MD       No Known Allergies  Social History   Tobacco Use   Smoking status: Never Smoker   Smokeless tobacco: Never Used  Substance Use Topics   Alcohol use: Not Currently    History reviewed. No pertinent family history.    Review of Systems  Constitutional: Positive for activity change.  HENT: Negative.   Respiratory: Negative.   Cardiovascular: Negative.   Gastrointestinal: Negative.   Genitourinary: Negative.   Musculoskeletal: Positive for gait problem and joint swelling.  Skin: Negative.   Psychiatric/Behavioral: Negative.      Objective:  Physical Exam Constitutional:      General: She is not in acute distress. HENT:     Head: Normocephalic and atraumatic.     Nose: Nose normal.  Eyes:     Extraocular Movements: Extraocular movements intact.     Pupils: Pupils are equal, round, and reactive to light.  Cardiovascular:     Rate and Rhythm: Regular rhythm.     Heart sounds: Normal heart sounds.  Pulmonary:     Effort: Pulmonary effort is normal. No respiratory distress.  Skin:    General: Skin is warm and dry.  Neurological:  General: No focal deficit present.     Mental Status: She is alert and oriented to person, place, and time.  Psychiatric:        Mood and Affect: Mood normal.     Vital signs in last 24 hours: Temp:  [97.7 F (36.5 C)] 97.7 F (36.5 C) (09/17 0556) Pulse Rate:  [72] 72 (09/17 0556) Resp:  [18] 18 (09/17 0556) BP: (164)/(71) 164/71 (09/17 0556) Weight:  [80.7 kg] 80.7 kg (09/17 0556)  Labs:  Estimated body mass index is 31.53 kg/m as calculated from the following:   Height as of this encounter: 5\' 3"  (1.6 m).   Weight as of this encounter: 80.7 kg.  Imaging Review Plain radiographs demonstrate  The overall alignment varus. There is evidence of loosening of the tibial components. The bone quality appears to be good for age and reported activity level..    Assessment/Plan:  End stage arthritis, left knee(s) with failed previous arthroplasty.   The patient history, physical examination, clinical judgment of the provider and imaging studies are consistent with end stage degenerative joint disease of the left knee(s), previous total knee arthroplasty. Revision total knee arthroplasty is deemed medically necessary. The treatment options including medical management, injection therapy, arthroscopy and revision arthroplasty were discussed at length. The risks and benefits of revision total knee arthroplasty were presented and reviewed. The risks due to aseptic loosening, infection, stiffness, patella tracking problems, thromboembolic complications and other imponderables were discussed. The patient acknowledged the explanation, agreed to proceed with the plan and consent was signed. Patient is being admitted for inpatient treatment for surgery, pain control, PT, OT, prophylactic antibiotics, VTE prophylaxis, progressive ambulation and ADL's and discharge planning.The patient is planning to be discharged home with home health services

## 2020-01-02 NOTE — Interval H&P Note (Signed)
History and Physical Interval Note:  01/02/2020 7:27 AM  Barbara Reyes  has presented today for surgery, with the diagnosis of painful loose total knee arthroplasty.  The various methods of treatment have been discussed with the patient and family. After consideration of risks, benefits and other options for treatment, the patient has consented to  Procedure(s): LEFT TOTAL KNEE REVISION (Left) as a surgical intervention.  The patient's history has been reviewed, patient examined, no change in status, stable for surgery.  I have reviewed the patient's chart and labs.  Questions were answered to the patient's satisfaction.     [name]

## 2020-01-02 NOTE — Anesthesia Procedure Notes (Signed)
Procedure Name: MAC Date/Time: 01/02/2020 7:45 AM Performed by: Jenne Campus, CRNA Pre-anesthesia Checklist: Patient identified, Emergency Drugs available, Suction available and Patient being monitored Oxygen Delivery Method: Simple face mask

## 2020-01-02 NOTE — Progress Notes (Signed)
Orthopedic Tech Progress Note Patient Details:  Barbara Reyes 1940/06/10 [id number]  Ortho Devices Type of Ortho Device: Knee Immobilizer Ortho Device/Splint Location: LUE Ortho Device/Splint Interventions: Ordered, Application   Post Interventions Patient Tolerated: Well Instructions Provided: Care of device   Donald Pore 01/02/2020, 12:22 PM

## 2020-01-02 NOTE — Plan of Care (Signed)

## 2020-01-02 NOTE — Anesthesia Postprocedure Evaluation (Signed)
Anesthesia Post Note  Patient: Barbara Reyes  Procedure(s) Performed: LEFT TOTAL KNEE REVISION (Left Knee)     Patient location during evaluation: Nursing Unit Anesthesia Type: Spinal Level of consciousness: oriented and awake and alert Pain management: pain level controlled Vital Signs Assessment: post-procedure vital signs reviewed and stable Respiratory status: spontaneous breathing and respiratory function stable Cardiovascular status: blood pressure returned to baseline and stable Postop Assessment: no headache, no backache, no apparent nausea or vomiting and patient able to bend at knees Anesthetic complications: no   No complications documented.  Last Vitals:  Vitals:   01/02/20 1224 01/02/20 1251  BP: 136/74 136/65  Pulse: 64 60  Resp: 16 15  Temp:    SpO2: 99% 97%    Last Pain:  Vitals:   01/02/20 1251  TempSrc:   PainSc: 0-No pain                 Trevor Iha

## 2020-01-02 NOTE — Evaluation (Addendum)
Physical Therapy Evaluation Patient Details Name: Barbara Reyes MRN: [id number] DOB: Sep 08, 1940 Today's Date: 01/02/2020   History of Present Illness  Pt is a 79 y.o. 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  Clinical Impression  Prior to admission, pt lives with her spouse and works as a Engineer, drilling and part time at an [employer] On POD #0, pt moving extremely well, ambulating 150 feet with a walker at a min guard assist level. Able to participate in seated exercises to follow for R knee strengthening and ROM. Achieving > 100 degrees knee flexion in a sitting position. Suspect excellent progress given evaluation, PLOF and motivation. Given her active lifestyle and occupation, pt would benefit from intensive OPPT upon discharge.     Follow Up Recommendations Outpatient PT    Equipment Recommendations  Rolling walker with 5" wheels (reports she has a Rollator, but unsure if she has a 2 wheeled walker)   Recommendations for Other Services       Precautions / Restrictions Precautions Precautions: Knee Precaution Booklet Issued: No Restrictions Weight Bearing Restrictions: Yes LUE Weight Bearing: Weight bearing as tolerated      Mobility  Bed Mobility Overal bed mobility: Independent                Transfers Overall transfer level: Needs assistance Equipment used: Rolling walker (2 wheeled) Transfers: Sit to/from Stand Sit to Stand: Supervision            Ambulation/Gait Ambulation/Gait assistance: Min guard Gait Distance (Feet): 150 Feet Assistive device: Rolling walker (2 wheeled) Gait Pattern/deviations: Step-to pattern;Step-through pattern;Antalgic;Decreased stance time - right;Decreased weight shift to right Gait velocity: decreased   General Gait Details: Pt utilizing step to pattern and intermittent step through  pattern. Cues for left heel strike, walker proximity, sequencing.  Stairs            Wheelchair Mobility    Modified Rankin (Stroke Patients Only)       Balance Overall balance assessment: Mild deficits observed, not formally tested                                           Pertinent Vitals/Pain Pain Assessment: Faces Faces Pain Scale: Hurts little more Pain Location: L knee Pain Descriptors / Indicators: Grimacing;Guarding Pain Intervention(s): Limited activity within patient's tolerance;Monitored during session;Patient requesting pain meds-RN notified;Ice applied    Home Living Family/patient expects to be discharged to:: Private residence Living Arrangements: Spouse/significant other Available Help at Discharge: Family Type of Home: House Home Access: Ramped entrance     Home Layout: Able to live on main level with bedroom/bathroom Home Equipment: Shower seat;Bedside commode;Walker - 4 wheels;Wheelchair - Copy - single point      Prior Function Level of Independence: Independent         Comments: Works as Engineer, drilling and also at [employer] part time     [employer]        Extremity/Trunk Assessment   Upper Extremity Assessment Upper Extremity Assessment: Overall WFL for tasks assessed    Lower Extremity Assessment Lower Extremity Assessment: LLE deficits/detail LLE Deficits / Details: s/p R knee revision. At least 3/5 strength. Seated knee flexion > 100 degrees    Cervical / Trunk Assessment Cervical / Trunk Assessment: Normal  Communication   Communication: No difficulties  Cognition Arousal/Alertness: Awake/alert Behavior During Therapy: WFL for tasks assessed/performed Overall Cognitive Status: Within Functional Limits for tasks assessed                                        General Comments      Exercises Total Joint Exercises Heel Slides: Left;10  reps;Seated Long Arc Quad: Both;10 reps;Seated General Exercises - Lower Extremity Hip Flexion/Marching: Both;10 reps;Seated   Assessment/Plan    PT Assessment Patient needs continued PT services  PT Problem List Decreased strength;Decreased range of motion;Decreased balance;Decreased mobility;Pain       PT Treatment Interventions DME instruction;Gait training;Stair training;Functional mobility training;Therapeutic activities;Therapeutic exercise;Balance training;Patient/family education    PT Goals (Current goals can be found in the Care Plan section)  Acute Rehab PT Goals Patient Stated Goal: return to doing aquatic therapy PT Goal Formulation: With patient Time For Goal Achievement: 01/16/20 Potential to Achieve Goals: Good    Frequency 7X/week   Barriers to discharge        Co-evaluation               AM-PAC PT "6 Clicks" Mobility  Outcome Measure Help needed turning from your back to your side while in a flat bed without using bedrails?: None Help needed moving from lying on your back to sitting on the side of a flat bed without using bedrails?: None Help needed moving to and from a bed to a chair (including a wheelchair)?: None Help needed standing up from a chair using your arms (e.g., wheelchair or bedside chair)?: None Help needed to walk in hospital room?: A Little Help needed climbing 3-5 steps with a railing? : A Little 6 Click Score: 22    End of Session Equipment Utilized During Treatment: Gait belt Activity Tolerance: Patient tolerated treatment well Patient left: in chair;with call bell/phone within reach Nurse Communication: Mobility status;Patient requests pain meds PT Visit Diagnosis: Pain;Difficulty in walking, not elsewhere classified (R26.2) Pain - Right/Left: Left Pain - part of body: Knee    Time: 3532-9924 PT Time Calculation (min) (ACUTE ONLY): 28 min   Charges:   PT Evaluation $PT Eval Low Complexity: 1 Low PT Treatments $Gait  Training: 8-22 mins          Barbara Reyes, PT, DPT Acute Rehabilitation Services Pager [phone] Office [phone]   Barbara Reyes 01/02/2020, 5:26 PM

## 2020-01-02 NOTE — Anesthesia Postprocedure Evaluation (Signed)
Anesthesia Post Note  Patient: Barbara Reyes  Procedure(s) Performed: LEFT TOTAL KNEE REVISION (Left Knee)     Patient location during evaluation: Nursing Unit Anesthesia Type: Spinal Level of consciousness: oriented and awake and alert Pain management: pain level controlled Vital Signs Assessment: post-procedure vital signs reviewed and stable Respiratory status: spontaneous breathing and respiratory function stable Cardiovascular status: blood pressure returned to baseline and stable Postop Assessment: no headache, no backache, no apparent nausea or vomiting and patient able to bend at knees Anesthetic complications: no   No complications documented.  Last Vitals:  Vitals:   01/02/20 1224 01/02/20 1251  BP: 136/74 136/65  Pulse: 64 60  Resp: 16 15  Temp:    SpO2: 99% 97%    Last Pain:  Vitals:   01/02/20 1251  TempSrc:   PainSc: 0-No pain                 Kailer Heindel A Dessiree Sze     

## 2020-01-02 NOTE — Anesthesia Procedure Notes (Addendum)
Spinal  Patient location during procedure: OR Start time: 01/02/2020 7:28 AM End time: 01/02/2020 7:36 AM Staffing Performed: anesthesiologist  Anesthesiologist: Trevor Iha, MD Preanesthetic Checklist Completed: patient identified, IV checked, site marked, risks and benefits discussed, surgical consent, monitors and equipment checked, pre-op evaluation and timeout performed Spinal Block Patient position: sitting Prep: DuraPrep Patient monitoring: heart rate, cardiac monitor, continuous pulse ox and blood pressure Approach: midline Location: L3-4 Injection technique: single-shot Needle Needle type: Sprotte  Needle gauge: 24 G Needle length: 9 cm Needle insertion depth: 6 cm Assessment Sensory level: T4 Additional Notes 1 attempt .

## 2020-01-02 NOTE — Transfer of Care (Signed)
Immediate Anesthesia Transfer of Care Note  Patient: Barbara Reyes  Procedure(s) Performed: LEFT TOTAL KNEE REVISION (Left Knee)  Patient Location: PACU  Anesthesia Type:MAC and Spinal  Level of Consciousness: awake, oriented and patient cooperative  Airway & Oxygen Therapy: Patient Spontanous Breathing and Patient connected to face mask oxygen  Post-op Assessment: Report given to RN and Post -op Vital signs reviewed and stable  Post vital signs: Reviewed  Last Vitals:  Vitals Value Taken Time  BP 100/75 01/02/20 1107  Temp    Pulse 73 01/02/20 1107  Resp 15 01/02/20 1107  SpO2 99 % 01/02/20 1107  Vitals shown include unvalidated device data.  Last Pain:  Vitals:   01/02/20 0556  TempSrc: Oral  PainSc: 0-No pain      Patients Stated Pain Goal: 3 (62/95/28 4132)  Complications: No complications documented.

## 2020-01-02 NOTE — Progress Notes (Signed)
RNCM from [facility] office requested to assist by Dr. Ophelia Charter to set up Home Health Physical Therapy for patient in anticipation of D/C home over the weekend. Referral and order sent to [facility] of [place] [phone]. Spoke with patient's husband and he states she has a rollator, FWW, cane, and wheelchair at home. Recommended to use the FWW for ambulation at this time instead of rollator. He states they do not have elevated toilet seats or handrails in bathroom at their home. Order for 3in1 sent to [facility] through [facility] for DME delivery to hospital prior to D/C. If questions regarding this please contact [facility] for DME. Please fax D/C summary or any new orders to [facility] once discharged to [phone].  Call with questions/concerns. Ralph Dowdy, RN Case Manager [phone].

## 2020-01-02 NOTE — Discharge Instructions (Signed)
INSTRUCTIONS AFTER JOINT REPLACEMENT   o Remove items at home which could result in a fall. This includes throw rugs or furniture in walking pathways o ICE to the affected joint every three hours while awake for 30 minutes at a time, for at least the first 3-5 days, and then as needed for pain and swelling.  Continue to use ice for pain and swelling. You may notice swelling that will progress down to the foot and ankle.  This is normal after surgery.  Elevate your leg when you are not up walking on it.   o Continue to use the breathing machine you got in the hospital (incentive spirometer) which will help keep your temperature down.  It is common for your temperature to cycle up and down following surgery, especially at night when you are not up moving around and exerting yourself.  The breathing machine keeps your lungs expanded and your temperature down.   DIET:  As you were doing prior to hospitalization, we recommend a well-balanced diet.  DRESSING / WOUND CARE / SHOWERING  You may change your dressing 3-5 days after surgery.  Then change the dressing every day with sterile gauze.  Please use good hand washing techniques before changing the dressing.  Do not use any lotions or creams on the incision until instructed by your surgeon.  Ok to shower 3 days postop without dressing on.  Do not tub soak.   ACTIVITY  o Increase activity slowly as tolerated, but follow the weight bearing instructions below.   o No driving for 6 weeks or until further direction given by your physician.  You cannot drive while taking narcotics.  o No lifting or carrying greater than 10 lbs. until further directed by your surgeon. o Avoid periods of inactivity such as sitting longer than an hour when not asleep. This helps prevent blood clots.  o You may return to work once you are authorized by your doctor.     WEIGHT BEARING   Weight bearing as tolerated with assist device (walker, cane, etc) as directed, use it  as long as suggested by your surgeon or therapist, typically at least 4-6 weeks.   EXERCISES  Results after joint replacement surgery are often greatly improved when you follow the exercise, range of motion and muscle strengthening exercises prescribed by your doctor. Safety measures are also important to protect the joint from further injury. Any time any of these exercises cause you to have increased pain or swelling, decrease what you are doing until you are comfortable again and then slowly increase them. If you have problems or questions, call your caregiver or physical therapist for advice.   Rehabilitation is important following a joint replacement. After just a few days of immobilization, the muscles of the leg can become weakened and shrink (atrophy).  These exercises are designed to build up the tone and strength of the thigh and leg muscles and to improve motion. Often times heat used for twenty to thirty minutes before working out will loosen up your tissues and help with improving the range of motion but do not use heat for the first two weeks following surgery (sometimes heat can increase post-operative swelling).   These exercises can be done on a training (exercise) mat, on the floor, on a table or on a bed. Use whatever works the best and is most comfortable for you.    Use music or television while you are exercising so that the exercises are a pleasant break  in your day. This will make your life better with the exercises acting as a break in your routine that you can look forward to.   Perform all exercises about fifteen times, three times per day or as directed.  You should exercise both the operative leg and the other leg as well.  Exercises include:   . Quad Sets - Tighten up the muscle on the front of the thigh (Quad) and hold for 5-10 seconds.   . Straight Leg Raises - With your knee straight (if you were given a brace, keep it on), lift the leg to 60 degrees, hold for 3  seconds, and slowly lower the leg.  Perform this exercise against resistance later as your leg gets stronger.  . Leg Slides: Lying on your back, slowly slide your foot toward your buttocks, bending your knee up off the floor (only go as far as is comfortable). Then slowly slide your foot back down until your leg is flat on the floor again.  [place] Wings: Lying on your back spread your legs to the side as far apart as you can without causing discomfort.  . Hamstring Strength:  Lying on your back, push your heel against the floor with your leg straight by tightening up the muscles of your buttocks.  Repeat, but this time bend your knee to a comfortable angle, and push your heel against the floor.  You may put a pillow under the heel to make it more comfortable if necessary.   A rehabilitation program following joint replacement surgery can speed recovery and prevent re-injury in the future due to weakened muscles. Contact your doctor or a physical therapist for more information on knee rehabilitation.    CONSTIPATION  Constipation is defined medically as fewer than three stools per week and severe constipation as less than one stool per week.  Even if you have a regular bowel pattern at home, your normal regimen is likely to be disrupted due to multiple reasons following surgery.  Combination of anesthesia, postoperative narcotics, change in appetite and fluid intake all can affect your bowels.   YOU MUST use at least one of the following options; they are listed in order of increasing strength to get the job done.  They are all available over the counter, and you may need to use some, POSSIBLY even all of these options:    Drink plenty of fluids (prune juice may be helpful) and high fiber foods Colace 100 mg by mouth twice a day  Senokot for constipation as directed and as needed Dulcolax (bisacodyl), take with full glass of water  Miralax (polyethylene glycol) once or twice a day as needed.  If  you have tried all these things and are unable to have a bowel movement in the first 3-4 days after surgery call either your surgeon or your primary doctor.    If you experience loose stools or diarrhea, hold the medications until you stool forms back up.  If your symptoms do not get better within 1 week or if they get worse, check with your doctor.  If you experience "the worst abdominal pain ever" or develop nausea or vomiting, please contact the office immediately for further recommendations for treatment.   ITCHING:  If you experience itching with your medications, try taking only a single pain pill, or even half a pain pill at a time.  You can also use Benadryl over the counter for itching or also to help with sleep.   TED  HOSE STOCKINGS:  Use stockings on both legs until for at least 2 weeks or as directed by physician office. They may be removed at night for sleeping.  MEDICATIONS:  See your medication summary on the "After Visit Summary" that nursing will review with you.  You may have some home medications which will be placed on hold until you complete the course of blood thinner medication.  It is important for you to complete the blood thinner medication as prescribed.  PRECAUTIONS:  If you experience chest pain or shortness of breath - call 911 immediately for transfer to the hospital emergency department.   If you develop a fever greater that 101 F, purulent drainage from wound, increased redness or drainage from wound, foul odor from the wound/dressing, or calf pain - CONTACT YOUR SURGEON.                                                   FOLLOW-UP APPOINTMENTS:  If you do not already have a post-op appointment, please call the office for an appointment to be seen by your surgeon.  Guidelines for how soon to be seen are listed in your "After Visit Summary", but are typically between 1-4 weeks after surgery.  OTHER INSTRUCTIONS:   Knee Replacement:  Do not place pillow under knee,  focus on keeping the knee straight while resting. CPM instructions: 0-90 degrees, 2 hours in the morning, 2 hours in the afternoon, and 2 hours in the evening. Place foam block, curve side up under heel at all times except when in CPM or when walking.  DO NOT modify, tear, cut, or change the foam block in any way.   DENTAL ANTIBIOTICS:  In most cases prophylactic antibiotics for Dental procdeures after total joint surgery are not necessary.  Exceptions are as follows:  1. History of prior total joint infection  2. Severely immunocompromised (Organ Transplant, cancer chemotherapy, Rheumatoid biologic meds such as Humera)  3. Poorly controlled diabetes (A1C &gt; 8.0, blood glucose over 200)  If you have one of these conditions, contact your surgeon for an antibiotic prescription, prior to your dental procedure.   MAKE SURE YOU:  . Understand these instructions.  . Get help right away if you are not doing well or get worse.    Thank you for letting us be a part of your medical care team.  It is a privilege we respect greatly.  We hope these instructions will help you stay on track for a fast and full recovery!

## 2020-01-02 NOTE — Op Note (Signed)
Preop diagnosis: Painful loose left total knee arthroplasty, tibial component  Postop diagnosis: Same  Procedure: Revision total knee arthroplasty tibial component.  Surgeon: Annell Greening, MD  Assistant: Zonia Kief, PA-C medically necessary and present with entire procedure  Tourniquet time: 1hour 4 minutes.  Explants Zimmer size 4 tibial component and tibial segment.  New implants:NexGen Zimmer revision tibia stemmed size 4.  20 mm polysize EF.  Since patient's original prosthesis had been placed and 11 mm x 100 mm.  Brief history: 79 year old female 23 years who had [place].  No op note was available and surgeon has retired more than 10 years ago.  Today on it was loose cocked up 20 degrees with a gap between the tibial tray and cement.  She has had pain and progressive.  Doubt setting was placed at 4 deformity instability and falling.  Procedure: After induction of spinal anesthesia TXA antibiotic prophylaxis proximal thigh tourniquet lateral post heel bump standard prepping and draping was performed from the tip the toes.  Patient had an S shaped incision and old incision was outlined with a sterile skin marker before Betadine Steri-Drape was applied.  Usual total knee sheets drapes were all used.  Timeout procedure completed leg was wrapped with an Esmarch tourniquet inflated.  All incision was opened and extended distally.  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  Tibial component was obviously loose and osteotome could be placed underneath it would leave her slightly eventually the tibial tray was removed and cement was chipped out using straight and flexible.  Sequential reaming was used we progressed up to an 11 mm so we could use the offset extensive work was done for mobilization.  The stem was rotated and placed at the number for setting  which allowed the stem to go down the shaft.  Fluoroscopic images AP lateral were used to confirm that were directly down the shaft and once the reamer was placed cuts were made off the reamer placed in the guide securing with 3 pins and then completing the tibial cut.  Keel preparation was performed some additional cement had been chipped out in order to have the keel preparation.  We progressed up from 12 and once we get the 20 mm bearing could barely be inserted but that gave full extension no hyperextension collateral ligaments were balanced and stable.  Trials removed pulse lavage cement was vacuum mixed once components were opened and assembled.  Cement was applied to the metaphyseal region and around the stem of the prosthesis.  Tibial component was cemented in impacted checked under fluoroscopy after the polywas inserted with difficulty.  Implants on the undersurface of the polydid not kitchen appropriately in event and it would not insert into the V flange until it is elevated with a Glorious Peach and then was able be inserted with a gun in the normal fashion.  Permit screw was screwed down holding the poly it was completely inserted flush with the tibial tray stable and would not move.  Cement was hard it took 15 minutes.  Good stability full extension good flexion pulse lavage Exparel Marcaine and standard layered closure for total knee arthroplasty with skin staples in the skin postop dressing and transferred to care room in stable condition.  You count was correct.  Hemostasis was obtained after the cement was hardened and prior to closure.  Popliteal structures were protected throughout  the case with the [facility] carefully placed.

## 2020-01-03 DIAGNOSIS — T8484XA Pain due to internal orthopedic prosthetic devices, implants and grafts, initial encounter: Secondary | ICD-10-CM | POA: Diagnosis not present

## 2020-01-03 LAB — CBC
HCT: 34.4 % — ABNORMAL LOW (ref 36.0–46.0)
Hemoglobin: 11 g/dL — ABNORMAL LOW (ref 12.0–15.0)
MCH: 30.1 pg (ref 26.0–34.0)
MCHC: 32 g/dL (ref 30.0–36.0)
MCV: 94.2 fL (ref 80.0–100.0)
Platelets: 230 10*3/uL (ref 150–400)
RBC: 3.65 MIL/uL — ABNORMAL LOW (ref 3.87–5.11)
RDW: 14.7 % (ref 11.5–15.5)
WBC: 12.4 10*3/uL — ABNORMAL HIGH (ref 4.0–10.5)
nRBC: 0 % (ref 0.0–0.2)

## 2020-01-03 LAB — BASIC METABOLIC PANEL
Anion gap: 9 (ref 5–15)
BUN: 12 mg/dL (ref 8–23)
CO2: 20 mmol/L — ABNORMAL LOW (ref 22–32)
Calcium: 8.7 mg/dL — ABNORMAL LOW (ref 8.9–10.3)
Chloride: 109 mmol/L (ref 98–111)
Creatinine, Ser: 0.81 mg/dL (ref 0.44–1.00)
GFR calc Af Amer: >60 mL/min (ref >60)
GFR calc non Af Amer: >60 mL/min (ref >60)
Glucose, Bld: 124 mg/dL — ABNORMAL HIGH (ref 70–99)
Potassium: 4.3 mmol/L (ref 3.5–5.1)
Sodium: 138 mmol/L (ref 135–145)

## 2020-01-03 NOTE — Progress Notes (Signed)
Physical Therapy Treatment Patient Details Name: Barbara Reyes MRN: [id number] DOB: 1940/12/27 Today's Date: 01/03/2020    History of Present Illness Pt is a 79 y.o. 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    PT Comments    Issued HEP and reviewed gt training.  Pt is progressing well and eager to return home.  Informed RN and PA that she is ready to d/c home from a mobility stand point.    Follow Up Recommendations  Outpatient PT     Equipment Recommendations  Rolling walker with 5" wheels    Recommendations for Other Services       Precautions / Restrictions Precautions Precautions: Knee Precaution Booklet Issued: No Restrictions Weight Bearing Restrictions: Yes LUE Weight Bearing: Weight bearing as tolerated    Mobility  Bed Mobility Overal bed mobility: Independent                Transfers Overall transfer level: Modified independent Equipment used: Rolling walker (2 wheeled) Transfers: Sit to/from Stand Sit to Stand: Modified independent (Device/Increase time)            Ambulation/Gait Ambulation/Gait assistance: Modified independent (Device/Increase time) Gait Distance (Feet): 220 Feet Assistive device: Rolling walker (2 wheeled) Gait Pattern/deviations: Step-through pattern;Trunk flexed Gait velocity: decreased   General Gait Details: Cues for upper trunk control.   Stairs Stairs: Yes Stairs assistance:  (reports she has ramp.)         Wheelchair Mobility    Modified Rankin (Stroke Patients Only)       Balance Overall balance assessment: Mild deficits observed, not formally tested                                          Cognition Arousal/Alertness: Awake/alert Behavior During Therapy: WFL for tasks assessed/performed Overall Cognitive Status: Within Functional Limits for tasks  assessed                                        Exercises Total Joint Exercises Goniometric ROM: 0-110* L knee General Exercises - Lower Extremity Ankle Circles/Pumps: AROM;Both;20 reps;Supine Quad Sets: AROM;Left;Supine;10 reps Short Arc Quad: AROM;Left;10 reps;Supine Long Arc Quad: AROM;Left;10 reps;Seated Heel Slides: AROM;Left;10 reps;Supine Hip ABduction/ADduction: AROM;Left;10 reps;Supine Straight Leg Raises: AROM;Left;10 reps;Supine    General Comments        Pertinent Vitals/Pain Pain Assessment: Faces Faces Pain Scale: Hurts little more Pain Location: L knee Pain Descriptors / Indicators: Grimacing;Guarding Pain Intervention(s): Monitored during session;Repositioned    Home Living                      Prior Function            PT Goals (current goals can now be found in the care plan section) Acute Rehab PT Goals Potential to Achieve Goals: Good Progress towards PT goals: Progressing toward goals    Frequency    7X/week      PT Plan Current plan remains appropriate    Co-evaluation              AM-PAC PT "6 Clicks" Mobility   Outcome Measure  Help needed turning from your back to your side while in a flat  bed without using bedrails?: None Help needed moving from lying on your back to sitting on the side of a flat bed without using bedrails?: None Help needed moving to and from a bed to a chair (including a wheelchair)?: None Help needed standing up from a chair using your arms (e.g., wheelchair or bedside chair)?: None Help needed to walk in hospital room?: None Help needed climbing 3-5 steps with a railing? : A Little 6 Click Score: 23    End of Session Equipment Utilized During Treatment: Gait belt Activity Tolerance: Patient tolerated treatment well Patient left: in chair;with call bell/phone within reach Nurse Communication: Mobility status;Patient requests pain meds PT Visit Diagnosis: Pain;Difficulty in  walking, not elsewhere classified (R26.2) Pain - Right/Left: Left Pain - part of body: Knee     Time: 1339-1405 PT Time Calculation (min) (ACUTE ONLY): 26 min  Charges:  $Gait Training: 8-22 mins $Therapeutic Exercise: 8-22 mins                     Barbara Reyes , PTA Acute Rehabilitation Services Pager [phone] Office [phone]     Barbara Reyes Barbara Reyes 01/03/2020, 2:18 PM

## 2020-01-03 NOTE — TOC Transition Note (Addendum)
Transition of Care [facility]) - CM/SW Discharge Note   Patient Details  Name: Barbara Reyes MRN: [id number] Date of Birth: [birth year]  Transition of Care [facility]) CM/SW Contact:  Deveron Furlong, RN 01/03/2020, 2:58 PM   Clinical Narrative:    Discussed dc plan with patient.  She is aware of arrangement with [facility] for HHPT.  She states 3n1 was delivered but is not needed.  Requested that [employer] come back to retrieve and cancel order.  Ledell Noss will pick it up.  No other DME is needed.   Will need HH PT order with [facility] if not sent from office.   Final next level of care: Home w Home Health Services

## 2020-01-03 NOTE — Progress Notes (Signed)
  Subjective: Barbara Reyes is a 79 y.o. female s/p left TKA revision.  They are POD1.  Pt's pain is controlled.  Pt denies numbness/tingling/weakness.  Pt has ambulated with little difficulty.     Objective: Vital signs in last 24 hours: Temp:  [97 F (36.1 C)-98.1 F (36.7 C)] 98.1 F (36.7 C) (09/18 0756) Pulse Rate:  [60-90] 90 (09/18 0756) Resp:  [15-21] 18 (09/18 0756) BP: (86-145)/(56-101) 124/69 (09/18 0756) SpO2:  [94 %-99 %] 97 % (09/18 0756)  Intake/Output from previous day: 09/17 0701 - 09/18 0700 In: 1390 [P.O.:240; I.V.:1000; IV Piggyback:100] Out: 975 [Urine:900; Blood:75] Intake/Output this shift: No intake/output data recorded.  Exam:  No gross blood or drainage overlying the dressing Left foot warm and well-perfused Sensation intact distally in the left foot Able to dorsiflex and plantarflex the left foot Easily able to perform straight leg raise   Labs: Recent Labs    01/03/20 0354  HGB 11.0*   Recent Labs    01/03/20 0354  WBC 12.4*  RBC 3.65*  HCT 34.4*  PLT 230   Recent Labs    01/03/20 0354  NA 138  K 4.3  CL 109  CO2 20*  BUN 12  CREATININE 0.81  GLUCOSE 124*  CALCIUM 8.7*   No results for input(s): LABPT, INR in the last 72 hours.  Assessment/Plan: Pt is POD1 s/p left TKA revision.    -Plan to discharge to home today or tomorrow pending patient's pain and PT eval. Did very well with PT yesterday, her preference is to go home today. She lives in [place] with a walker  Julieanne Cotton 01/03/2020, 8:37 AM

## 2020-01-03 NOTE — Progress Notes (Signed)
Dr. Gregor Hams called me about discharge orders ready for pt.  Pt asked for pain medication for the ride home.  He stated he could give her a one time dose of Celebrex 100 mg, but pt was ready to go and did not want to wait for it.  Pt discharged with equipment, but not Celebrex.  Discharge instructions given and reviewed by Daneil Dan RN

## 2020-01-03 NOTE — Progress Notes (Signed)
Called Dr. Ophelia Charter answering service for DC order. Dr. August Saucer on call and they will page him.

## 2020-01-03 NOTE — Care Management Obs Status (Signed)
MEDICARE OBSERVATION STATUS NOTIFICATION   Patient Details  Name: Barbara Reyes MRN: [id number] Date of Birth: [birth year]   Medicare Observation Status Notification Given:  Yes    Deveron Furlong, RN 01/03/2020, 3:04 PM

## 2020-01-05 ENCOUNTER — Encounter (HOSPITAL_COMMUNITY): Payer: Self-pay | Admitting: Orthopaedic Surgery

## 2020-01-07 ENCOUNTER — Telehealth: Payer: Self-pay | Admitting: Orthopaedic Surgery

## 2020-01-07 LAB — AEROBIC/ANAEROBIC CULTURE W GRAM STAIN (SURGICAL/DEEP WOUND)
Culture: NO GROWTH
Gram Stain: NONE SEEN

## 2020-01-07 NOTE — Discharge Summary (Signed)
Physician Discharge Summary      Patient ID: Barbara Reyes MRN: [id number] DOB/AGE: 1941-02-16 79 y.o.  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 female who was admitted 01/02/2020 with a chief complaint of left knee pain, and found to have a diagnosis of left knee prosthesis loosening.  They were brought to the operating room on 01/02/2020 and underwent the above named procedures.  Pt awoke from anesthesia without complication and was transferred to the floor. On POD1, patient's pain was controlled and she mobilized well with PT. She was discharged home on POD1.  Pt will f/u with Dr. Ophelia Charter in clinic in ~2 weeks.   Antibiotics given:  Anti-infectives (From admission, onward)   Start     Dose/Rate Route Frequency Ordered Stop   01/02/20 1600  ceFAZolin (ANCEF) IVPB 1 g/50 mL premix        1 g 100 mL/hr over 30 Minutes Intravenous Every 8 hours 01/02/20 1421 01/02/20 2318   01/02/20 0615  ceFAZolin (ANCEF) IVPB 2g/100 mL premix        2 g 200 mL/hr over 30 Minutes Intravenous On call to O.R. 01/02/20 0601 01/02/20 0805   01/02/20 0552  ceFAZolin (ANCEF) 2-4 GM/100ML-% IVPB       Note to Pharmacy: Evern Bio   : cabinet override      01/02/20 0552 01/02/20 0806    .  Recent vital signs:  Vitals:   01/03/20 0457 01/03/20 0756  BP: 131/69 124/69  Pulse: 85 90  Resp:  18  Temp: 97.9 F (36.6 C) 98.1 F (36.7 C)  SpO2: 96% 97%    Recent laboratory studies:  Results for orders placed or performed during the hospital encounter of 01/02/20  Aerobic/Anaerobic Culture (surgical/deep wound)   Specimen: Synovial,  Left Knee; Body Fluid  Result Value Ref Range   Specimen Description FLUID    Special Requests LEFT KNEE SPEC A    Gram Stain NO WBC SEEN NO ORGANISMS SEEN     Culture      No growth aerobically or anaerobically. Performed at [facility], [address], [place], [place] [address]    Report Status 01/07/2020 FINAL   CBC  Result Value Ref Range   WBC 12.4 (H) 4.0 - 10.5 K/uL   RBC 3.65 (L) 3.87 - 5.11 MIL/uL   Hemoglobin 11.0 (L) 12.0 - 15.0 g/dL   HCT 45.6 (L) 36 - 46 %   MCV 94.2 80.0 - 100.0 fL   MCH 30.1 26.0 - 34.0 pg   MCHC 32.0 30.0 - 36.0 g/dL   RDW 25.6 38.9 - 37.3 %   Platelets 230 150 - 400 K/uL   nRBC 0.0 0.0 - 0.2 %  Basic metabolic panel  Result Value Ref Range   Sodium 138 135 - 145 mmol/L   Potassium 4.3 3.5 - 5.1 mmol/L   Chloride 109 98 - 111 mmol/L   CO2 20 (L) 22 - 32 mmol/L   Glucose, Bld 124 (H) 70 - 99 mg/dL   BUN 12 8 - 23 mg/dL   Creatinine, Ser 4.28 0.44 - 1.00 mg/dL   Calcium 8.7 (L) 8.9 -  10.3 mg/dL   GFR calc non Af Amer >60 >60 mL/min   GFR calc Af Amer >60 >60 mL/min   Anion gap 9 5 - 15  ABO/Rh  Result Value Ref Range   ABO/RH(D)      O POS Performed at [facility], [address] [place], [place] [address]     Discharge Medications:   Allergies as of 01/03/2020   No Known Allergies     Medication List    TAKE these medications   aspirin EC 325 MG tablet Take 1 tablet (325 mg total) by mouth daily. Must take at least 4 weeks postop for DVT prophylaxis   ferrous sulfate 325 (65 FE) MG EC tablet Take 325 mg by mouth 2 (two) times daily with a meal.   methocarbamol 500 MG tablet Commonly known as: Robaxin Take 1 tablet (500 mg total) by mouth every 6 (six) hours as needed for muscle spasms.   multivitamin with minerals Tabs tablet Take 1 tablet by mouth daily.   oxyCODONE-acetaminophen 5-325 MG tablet Commonly known as: PERCOCET/ROXICET Take 1 tablet by mouth every 6 (six) hours as needed for severe pain.        Diagnostic Studies: DG Chest 2 View  Result Date: 12/30/2019 CLINICAL DATA:  79 year old female under preoperative evaluation prior to knee surgery. EXAM: CHEST - 2 VIEW COMPARISON:  No priors. FINDINGS: Lung volumes are normal. No consolidative airspace disease. No pleural effusions. No pneumothorax. No pulmonary nodule or mass noted. Large hiatal hernia. Pulmonary vasculature and the cardiomediastinal silhouette are otherwise within normal limits. Atherosclerotic calcifications in the thoracic aorta. IMPRESSION: 1. No radiographic evidence of acute cardiopulmonary disease. 2. Aortic atherosclerosis. 3. Large hiatal hernia. Electronically Signed   By: Trudie Reed M.D.   On: 12/30/2019 16:36   DG Knee 1-2 Views Left  Result Date: 01/02/2020 CLINICAL DATA:  79 year old female status post left knee arthroplasty EXAM: LEFT KNEE - 1-2 VIEW COMPARISON:  None. FINDINGS: Surgical changes of knee arthroplasty procedure. No evidence of immediate hardware complication. Expected subcutaneous and intra-articular gas. Cutaneous skin staples are noted. No lytic or blastic osseous lesion. IMPRESSION: Surgical changes of left total knee arthroplasty without evidence of immediate complication. Electronically Signed   By: Malachy Moan M.D.   On: 01/02/2020 12:09   DG Knee 1-2 Views Left  Result Date: 01/02/2020 CLINICAL DATA:  LEFT total knee arthroplasty EXAM: LEFT KNEE - 1-2 VIEW; DG C-ARM 1-60 MIN COMPARISON:  11/13/2019 FLUOROSCOPY TIME:  0 minutes 7.8 seconds Dose: 0.71 mGy Images: 3 FINDINGS: Osseous demineralization. Components of a RIGHT knee prosthesis are identified. No fracture, dislocation or bone destruction seen. Femoral metaphysis not visualized. IMPRESSION: LEFT knee prosthesis and osseous demineralization without definite acute bony abnormalities. Electronically Signed   By: Ulyses Southward M.D.   On: 01/02/2020 10:54   DG C-Arm 1-60 Min  Result Date: 01/02/2020 CLINICAL DATA:  LEFT total  knee arthroplasty EXAM: LEFT KNEE - 1-2 VIEW; DG C-ARM 1-60 MIN COMPARISON:  11/13/2019 FLUOROSCOPY TIME:  0 minutes 7.8 seconds Dose: 0.71 mGy Images: 3 FINDINGS: Osseous demineralization. Components of a RIGHT knee prosthesis are identified. No fracture, dislocation or bone destruction seen. Femoral metaphysis not visualized. IMPRESSION: LEFT knee prosthesis and osseous demineralization without definite acute bony abnormalities. Electronically Signed   By: Ulyses Southward M.D.   On: 01/02/2020 10:54    Disposition: Discharge disposition: 01-Home or Self Care       Discharge Instructions    CPM  Complete by: As directed    Call MD / Call 911   Complete by: As directed    If you experience chest pain or shortness of breath, CALL 911 and be transported to the hospital emergency room.  If you develope a fever above 101 F, pus (white drainage) or increased drainage or redness at the wound, or calf pain, call your surgeon's office.   Constipation Prevention   Complete by: As directed    Drink plenty of fluids.  Prune juice may be helpful.  You may use a stool softener, such as Colace (over the counter) 100 mg twice a day.  Use MiraLax (over the counter) for constipation as needed.   Diet - low sodium heart healthy   Complete by: As directed    Discharge instructions   Complete by: As directed    Do not remove the dressing, we will remove it at your first post-op appointment.  Do not take a bath or soak the knee in a tub or pool.  Keep dressing dry.  You may shower if the dressing stays dry.  You may weightbear as you can tolerate on the operative leg with a walker.  Continue using the CPM machine 3 times per day for one hour each time, increasing the degrees of range of motion daily.  Use the blue cradle boot under your heel to work on getting your leg straight.  Do NOT put a pillow under your knee.  You will follow-up with Dr. Ophelia Charter in the clinic in 1-2 weeks at your given appointment date.    Increase activity slowly as tolerated   Complete by: As directed        Follow-up Information    Schedule an appointment as soon as possible for a visit with [name], MD.   Specialty: Orthopedic Surgery Why: need return office visit 2 weeks postop.  call to schedule appointment Contact information: [address] [place] [address] [phone]        [facility] Follow up.   Why: This agency will contact you about physical therapy home visits. Contact information: [phone]               Signed: Julieanne Cotton 01/07/2020, 9:14 PM

## 2020-01-07 NOTE — Telephone Encounter (Signed)
01/02/20 OP note faxed to [facility] for CPM [phone]. Order also faxed

## 2020-01-15 ENCOUNTER — Ambulatory Visit: Payer: Self-pay

## 2020-01-15 ENCOUNTER — Other Ambulatory Visit: Payer: Self-pay

## 2020-01-15 ENCOUNTER — Ambulatory Visit (INDEPENDENT_AMBULATORY_CARE_PROVIDER_SITE_OTHER): Payer: Medicare HMO | Admitting: Orthopaedic Surgery

## 2020-01-15 VITALS — BP 133/65 | HR 69

## 2020-01-15 DIAGNOSIS — Z96652 Presence of left artificial knee joint: Secondary | ICD-10-CM

## 2020-01-15 DIAGNOSIS — Z96659 Presence of unspecified artificial knee joint: Secondary | ICD-10-CM

## 2020-01-15 NOTE — Progress Notes (Signed)
Postop knee revision left tibial longstem.  X-ray showed good position alignment.  She is already flexing to 110 she is using her CPM at home has full extension.  She is off her walker ambulates with a cane and can actually walk some without the cane.  She has a heated pool where she does therapy in [place] and will transition from home health PT to her own gym.  She has had previous opposite total knee arthroplasty and knows what exercises to work on.  She is very happy with the surgical result Staples are harvested today.  Recheck 4 weeks.

## 2020-01-16 ENCOUNTER — Inpatient Hospital Stay: Payer: Medicare HMO | Admitting: Orthopaedic Surgery

## 2020-01-25 ENCOUNTER — Other Ambulatory Visit: Payer: Self-pay | Admitting: Surgery

## 2020-01-26 NOTE — Telephone Encounter (Signed)
Please advise 

## 2020-02-19 ENCOUNTER — Ambulatory Visit (INDEPENDENT_AMBULATORY_CARE_PROVIDER_SITE_OTHER): Payer: Medicare HMO | Admitting: Orthopaedic Surgery

## 2020-02-19 ENCOUNTER — Encounter: Payer: Self-pay | Admitting: Orthopaedic Surgery

## 2020-02-19 ENCOUNTER — Other Ambulatory Visit: Payer: Self-pay

## 2020-02-19 DIAGNOSIS — Z96659 Presence of unspecified artificial knee joint: Secondary | ICD-10-CM | POA: Insufficient documentation

## 2020-02-19 NOTE — Progress Notes (Signed)
   Post-Op Visit Note   Patient: Barbara Reyes           Date of Birth: [birth year]           MRN: [id number] Visit Date: 02/19/2020 PCP: Joaquin Courts, DO   Assessment & Plan: Post left total knee revision.  Swelling is down she is walking without a cane.  Occasionally some soreness in the tibia has good motion is happy with the surgical result and I will release her from care.  Chief Complaint:  Chief Complaint  Patient presents with  . Left Knee - Follow-up    01/12/2020 Left total knee revision   Visit Diagnoses:  1. History of revision of total knee arthroplasty     Plan: Patient is happy the results.  She is swimming walking shopping.  We will release her from care and check her on an as-needed basis.  Follow-Up Instructions: No follow-ups on file.   Orders:  No orders of the defined types were placed in this encounter.  No orders of the defined types were placed in this encounter.   Imaging: No results found.  PMFS History: Patient Active Problem List   Diagnosis Date Noted  . History of revision of total knee arthroplasty 02/19/2020  . 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 Surgery, elective   . Loose left total knee arthroplasty ([facility]) 11/16/2019   Past Medical History:  Diagnosis Date  . Arthritis   . Complication of anesthesia   . H/O bladder problems   . PONV (postoperative nausea and vomiting)     No family history on file.  Past Surgical History:  Procedure Laterality Date  . CYST EXCISION    . EYE SURGERY    . KNEE SURGERY Right 01/2002   right TKA  . KNEE SURGERY Left 12/1999   left TKA  . TOTAL KNEE REVISION Left 01/02/2020   Procedure: LEFT TOTAL KNEE REVISION;  Surgeon: [name], MD;  Location: [facility] OR;  Service: Orthopedics;  Laterality: Left;   Social History   Occupational History  . Not on file  Tobacco Use  . Smoking status: Never Smoker  . Smokeless tobacco: Never Used  Vaping Use    . Vaping Use: Never used  Substance and Sexual Activity  . Alcohol use: Not Currently  . Drug use: Never  . Sexual activity: Yes

## 2020-04-06 ENCOUNTER — Telehealth: Payer: Self-pay

## 2020-04-06 NOTE — Telephone Encounter (Signed)
Patient's procedure is slotted for 2 1/2 hours on 04/21/2020. They wanted to be sure that patient could be in stirrups for this amount of time. Need clearance note faxed to Attn: Sherry at [phone].

## 2020-04-06 NOTE — Telephone Encounter (Signed)
Sherry-Dr. Daylene Posey surgery coordinator is calling to get surgical clearance on this pt for a female surgery scheduled for 04/21/20. Pt will be in stirups for some time during this surgery. Please call her back to discuss  CB # [phone]

## 2020-04-06 NOTE — Telephone Encounter (Signed)
No problem , all good

## 2020-04-06 NOTE — Telephone Encounter (Signed)
Patient status post left total knee 01/02/2020.  Please advise.

## 2020-04-08 NOTE — Telephone Encounter (Signed)
Ok from an orthopedic standpoint per Dr. Ophelia Charter. Faxed note.

## 2022-07-27 IMAGING — RF DG KNEE 1-2V*L*
1 series · 3 of 3 positions shown · non-contrast
Comparison: 11/13/2019

FLUOROSCOPY TIME:  0 minutes 7.8 seconds

Dose: 0.71 mGy

Images: 3

CLINICAL DATA: LEFT total knee arthroplasty

EXAM:
LEFT KNEE - 1-2 VIEW; DG C-ARM 1-60 MIN

[Series 1: run · 3 of 3 slices shown]
[im 1/3]
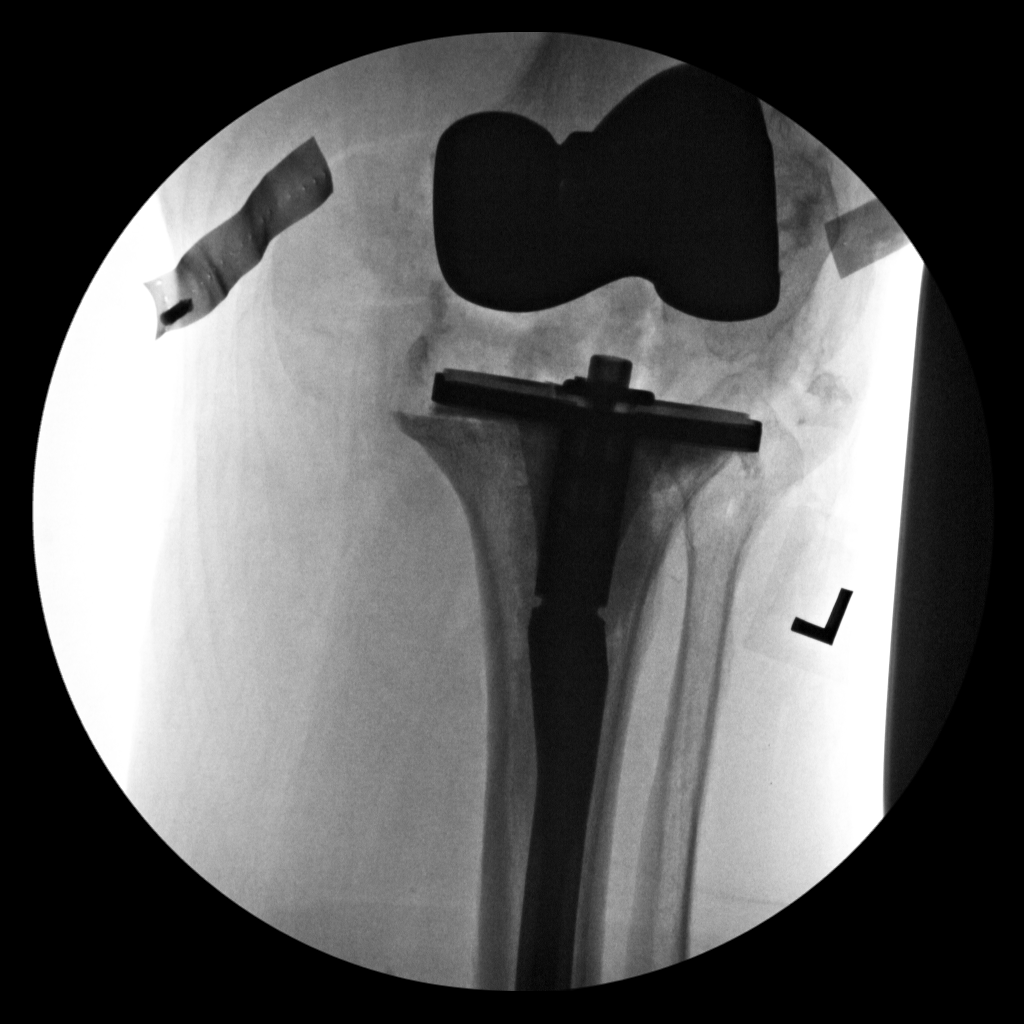
[im 2/3]
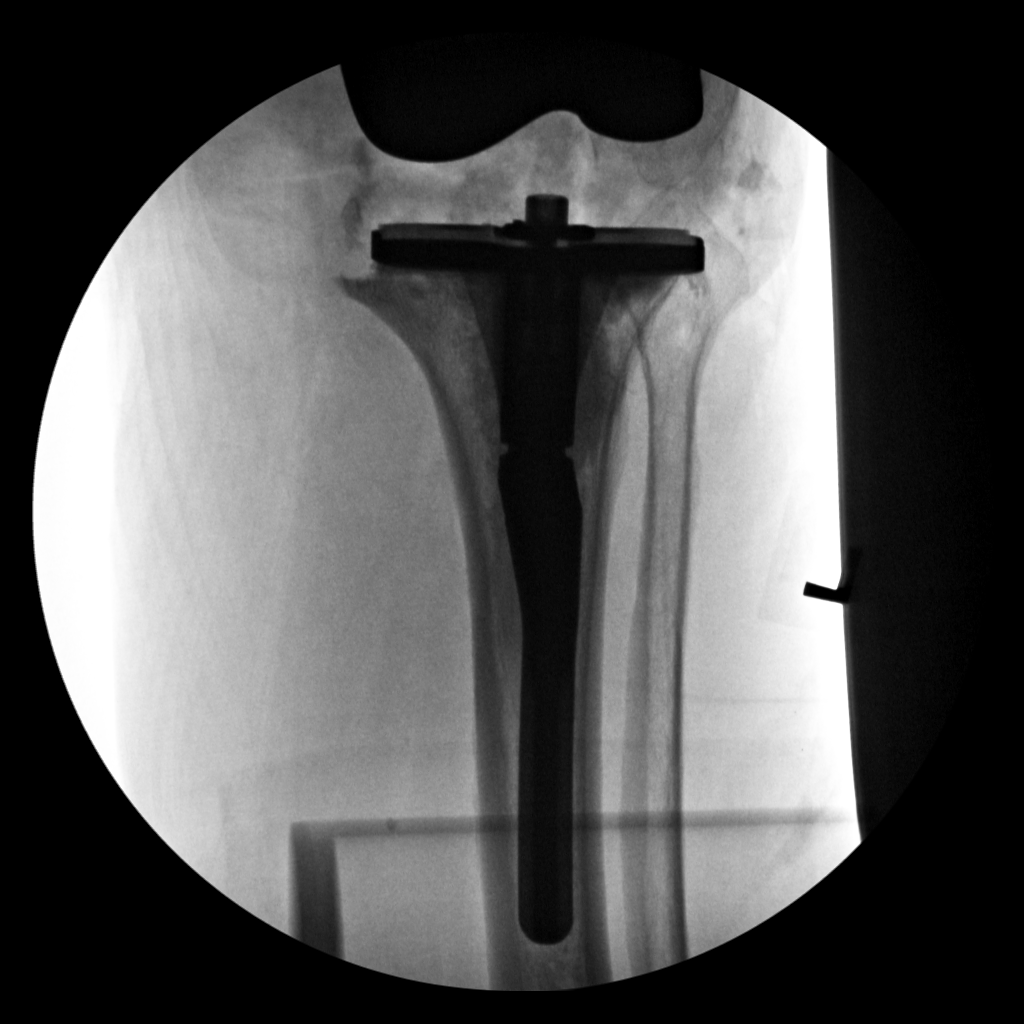
[im 3/3]
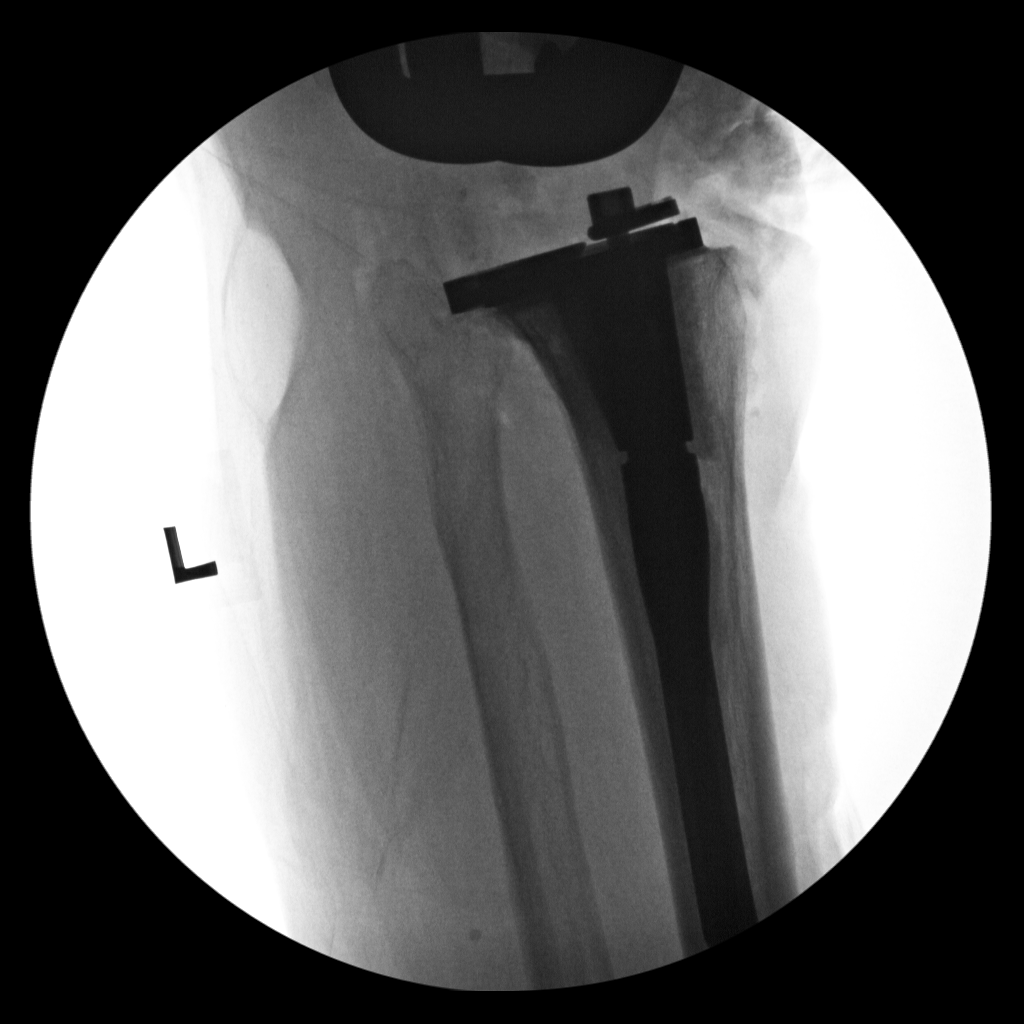

[3 of 3 positions shown; findings below may reference images not displayed]

FINDINGS: Osseous demineralization.

Components of a RIGHT knee prosthesis are identified.

No fracture, dislocation or bone destruction seen.

Femoral metaphysis not visualized.
IMPRESSION: LEFT knee prosthesis and osseous demineralization without definite
acute bony abnormalities.

## 2022-07-27 IMAGING — DX DG KNEE 1-2V*L*
1 series · 2 of 2 positions shown · non-contrast
Comparison: None.

CLINICAL DATA: 79-year-old female status post left knee
arthroplasty

EXAM:
LEFT KNEE - 1-2 VIEW

[Series 1: knee · 0.14mm/px · 2 of 2 slices shown]
[im 1/2]
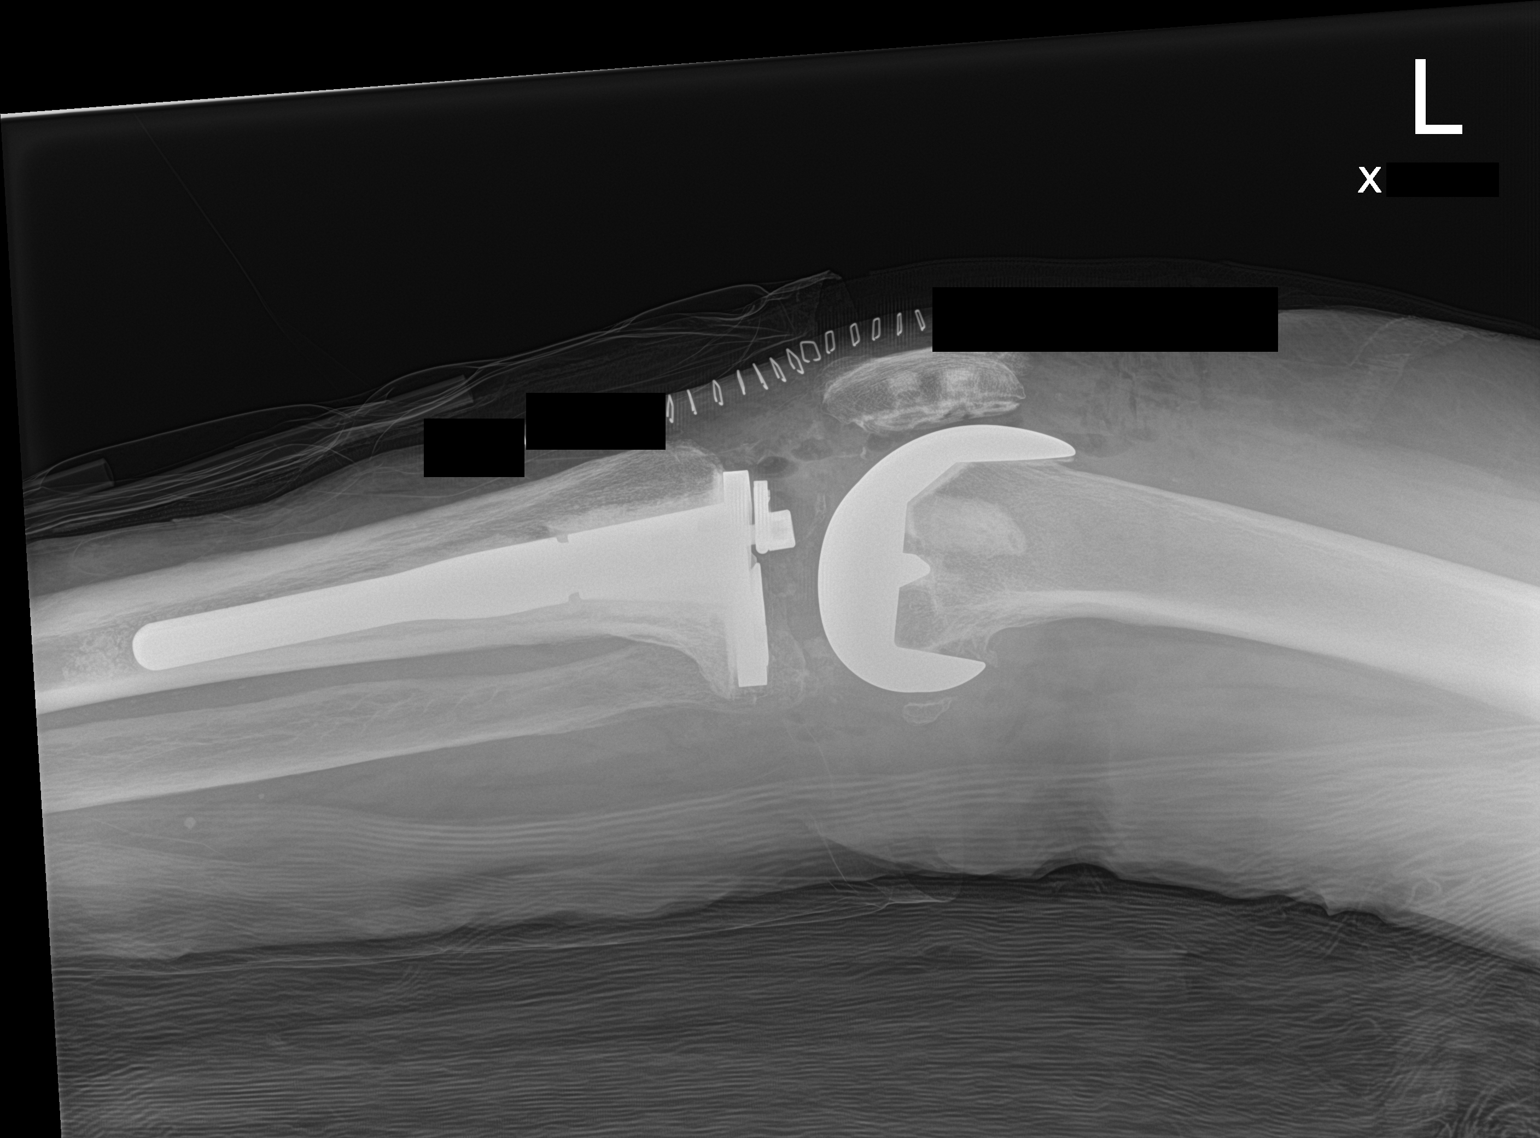
[im 2/2]
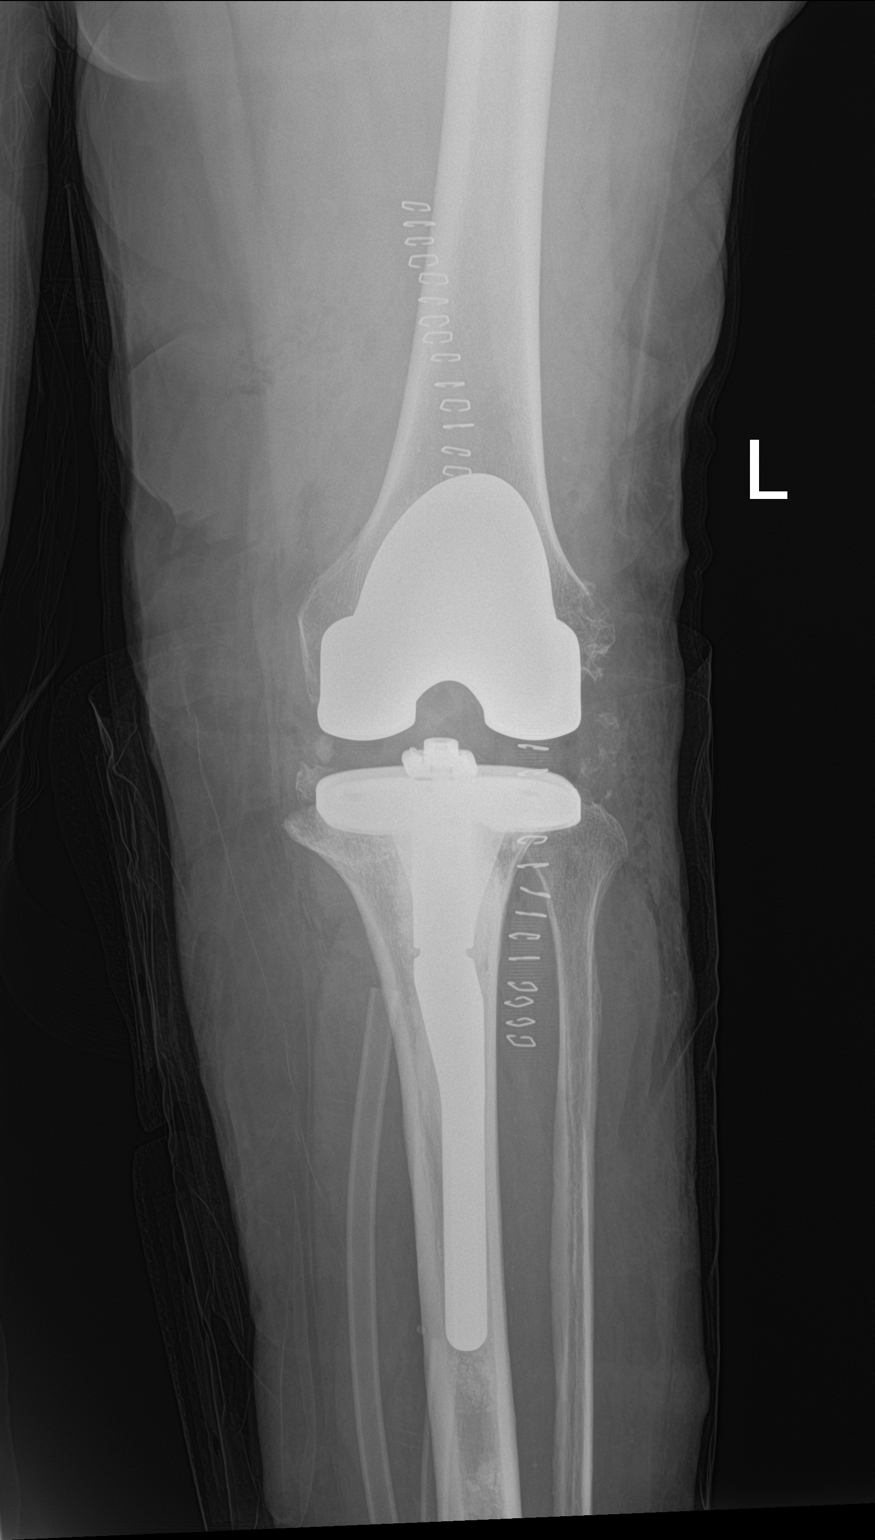

[2 of 2 positions shown; findings below may reference images not displayed]

FINDINGS: Surgical changes of knee arthroplasty procedure. No evidence of
immediate hardware complication. Expected subcutaneous and
intra-articular gas. Cutaneous skin staples are noted. No lytic or
blastic osseous lesion.
IMPRESSION: Surgical changes of left total knee arthroplasty without evidence of
immediate complication.
